# Patient Record
Sex: Female | Born: 1985 | Race: Black or African American | Hispanic: No | Marital: Single | Smoking: Former smoker
Health system: Southern US, Community
[De-identification: ages and names within clinical notes are randomized; demographics above are authoritative.]

## PROBLEM LIST (undated history)

## (undated) ENCOUNTER — Inpatient Hospital Stay (HOSPITAL_COMMUNITY): Payer: Self-pay

## (undated) DIAGNOSIS — J302 Other seasonal allergic rhinitis: Secondary | ICD-10-CM

## (undated) DIAGNOSIS — I10 Essential (primary) hypertension: Secondary | ICD-10-CM

## (undated) DIAGNOSIS — O009 Unspecified ectopic pregnancy without intrauterine pregnancy: Secondary | ICD-10-CM

## (undated) HISTORY — PX: TONSILLECTOMY: SHX5618

## (undated) HISTORY — DX: Essential (primary) hypertension: I10

## (undated) HISTORY — PX: WISDOM TOOTH EXTRACTION: SHX21

## (undated) HISTORY — PX: OTHER SURGICAL HISTORY: SHX169

## (undated) HISTORY — PX: TONSILLECTOMY: SUR1361

---
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
## 2011-12-07 ENCOUNTER — Ambulatory Visit: Admit: 2011-12-07 | Payer: Self-pay | Admitting: Obstetrics & Gynecology

## 2011-12-07 ENCOUNTER — Encounter (HOSPITAL_COMMUNITY): Payer: Self-pay

## 2011-12-07 ENCOUNTER — Inpatient Hospital Stay (HOSPITAL_COMMUNITY): Payer: BC Managed Care – PPO

## 2011-12-07 ENCOUNTER — Encounter (HOSPITAL_COMMUNITY)
Admission: AD | Disposition: A | Discharge status: Home / Self Care | Payer: Self-pay | Source: Ambulatory Visit | Attending: Obstetrics & Gynecology

## 2011-12-07 ENCOUNTER — Encounter (HOSPITAL_COMMUNITY): Payer: Self-pay | Admitting: Anesthesiology

## 2011-12-07 ENCOUNTER — Inpatient Hospital Stay (HOSPITAL_COMMUNITY)
Admission: AD | Admit: 2011-12-07 | Discharge: 2011-12-08 | Disposition: A | Discharge status: Home / Self Care | Payer: BC Managed Care – PPO | Source: Ambulatory Visit | Attending: Obstetrics & Gynecology | Admitting: Obstetrics & Gynecology

## 2011-12-07 ENCOUNTER — Inpatient Hospital Stay (HOSPITAL_COMMUNITY): Payer: BC Managed Care – PPO | Admitting: Anesthesiology

## 2011-12-07 DIAGNOSIS — R109 Unspecified abdominal pain: Secondary | ICD-10-CM | POA: Insufficient documentation

## 2011-12-07 DIAGNOSIS — O00109 Unspecified tubal pregnancy without intrauterine pregnancy: Secondary | ICD-10-CM | POA: Insufficient documentation

## 2011-12-07 DIAGNOSIS — O009 Unspecified ectopic pregnancy without intrauterine pregnancy: Secondary | ICD-10-CM

## 2011-12-07 HISTORY — PX: LAPAROSCOPY: SHX197

## 2011-12-07 LAB — URINALYSIS, ROUTINE W REFLEX MICROSCOPIC
Glucose, UA: NEGATIVE mg/dL
Ketones, ur: NEGATIVE mg/dL
Leukocytes, UA: NEGATIVE
Protein, ur: NEGATIVE mg/dL
Urobilinogen, UA: 0.2 mg/dL (ref 0.0–1.0)

## 2011-12-07 LAB — CBC
HCT: 37.7 % (ref 36.0–46.0)
Hemoglobin: 12.6 g/dL (ref 12.0–15.0)
MCH: 28.8 pg (ref 26.0–34.0)
MCHC: 33.4 g/dL (ref 30.0–36.0)
RDW: 13.1 % (ref 11.5–15.5)

## 2011-12-07 LAB — URINE MICROSCOPIC-ADD ON

## 2011-12-07 LAB — POCT PREGNANCY, URINE: Preg Test, Ur: POSITIVE — AB

## 2011-12-07 LAB — ABO/RH: ABO/RH(D): A POS

## 2011-12-07 SURGERY — LAPAROSCOPY OPERATIVE
Anesthesia: General | Site: Abdomen | Laterality: Right | Wound class: Clean Contaminated

## 2011-12-07 MED ORDER — ROCURONIUM BROMIDE 100 MG/10ML IV SOLN
INTRAVENOUS | Status: DC | PRN
  Administered 2011-12-07: 30 mg via INTRAVENOUS

## 2011-12-07 MED ORDER — DOCUSATE SODIUM 100 MG PO CAPS: 100.0000 mg | ORAL_CAPSULE | Freq: Two times a day (BID) | ORAL | Status: DC | PRN

## 2011-12-07 MED ORDER — LACTATED RINGERS IV BOLUS (SEPSIS): 1000.0000 mL | Freq: Once | INTRAVENOUS | Status: DC

## 2011-12-07 MED ORDER — LACTATED RINGERS IV SOLN
INTRAVENOUS | Status: DC
  Administered 2011-12-07 (×2): via INTRAVENOUS
  Administered 2011-12-08: 1000 mL via INTRAVENOUS

## 2011-12-07 MED ORDER — BUPIVACAINE HCL (PF) 0.5 % IJ SOLN
INTRAMUSCULAR | Status: DC | PRN
  Administered 2011-12-07: 5 mL

## 2011-12-07 MED ORDER — LACTATED RINGERS IR SOLN
Status: DC | PRN
  Administered 2011-12-07: 3000 mL

## 2011-12-07 MED ORDER — KETOROLAC TROMETHAMINE 30 MG/ML IJ SOLN: 15.0000 mg | Freq: Once | INTRAMUSCULAR | Status: AC | PRN

## 2011-12-07 MED ORDER — DEXAMETHASONE SODIUM PHOSPHATE 10 MG/ML IJ SOLN
INTRAMUSCULAR | Status: DC | PRN
  Administered 2011-12-07: 10 mg via INTRAVENOUS

## 2011-12-07 MED ORDER — CEFAZOLIN SODIUM-DEXTROSE 2-3 GM-% IV SOLR
2.0000 g | Freq: Once | INTRAVENOUS | Status: AC
  Administered 2011-12-07: 2 g via INTRAVENOUS
  Filled 2011-12-07: qty 50

## 2011-12-07 MED ORDER — GLYCOPYRROLATE 0.2 MG/ML IJ SOLN
INTRAMUSCULAR | Status: DC | PRN
  Administered 2011-12-07: 0.4 mg via INTRAVENOUS

## 2011-12-07 MED ORDER — FAMOTIDINE IN NACL 20-0.9 MG/50ML-% IV SOLN
20.0000 mg | Freq: Once | INTRAVENOUS | Status: AC
  Administered 2011-12-07: 20 mg via INTRAVENOUS
  Filled 2011-12-07: qty 50

## 2011-12-07 MED ORDER — CITRIC ACID-SODIUM CITRATE 334-500 MG/5ML PO SOLN
30.0000 mL | Freq: Once | ORAL | Status: AC
  Administered 2011-12-07: 30 mL via ORAL
  Filled 2011-12-07: qty 15

## 2011-12-07 MED ORDER — NEOSTIGMINE METHYLSULFATE 1 MG/ML IJ SOLN
INTRAMUSCULAR | Status: DC | PRN
  Administered 2011-12-07: 3 mg via INTRAVENOUS

## 2011-12-07 MED ORDER — SUCCINYLCHOLINE CHLORIDE 20 MG/ML IJ SOLN
INTRAMUSCULAR | Status: DC | PRN
  Administered 2011-12-07: 120 mg via INTRAVENOUS

## 2011-12-07 MED ORDER — LIDOCAINE HCL (CARDIAC) 20 MG/ML IV SOLN
INTRAVENOUS | Status: DC | PRN
  Administered 2011-12-07: 40 mg via INTRAVENOUS

## 2011-12-07 MED ORDER — FENTANYL CITRATE 0.05 MG/ML IJ SOLN
25.0000 ug | INTRAMUSCULAR | Status: DC | PRN
  Administered 2011-12-08 (×2): 50 ug via INTRAVENOUS

## 2011-12-07 MED ORDER — OXYCODONE-ACETAMINOPHEN 5-325 MG PO TABS: 1.0000 | ORAL_TABLET | Freq: Four times a day (QID) | ORAL | Status: DC | PRN

## 2011-12-07 MED ORDER — IBUPROFEN 600 MG PO TABS: 600.0000 mg | ORAL_TABLET | Freq: Four times a day (QID) | ORAL | Status: DC | PRN

## 2011-12-07 MED ORDER — MIDAZOLAM HCL 5 MG/5ML IJ SOLN
INTRAMUSCULAR | Status: DC | PRN
  Administered 2011-12-07: 2 mg via INTRAVENOUS

## 2011-12-07 MED ORDER — KETOROLAC TROMETHAMINE 60 MG/2ML IM SOLN
INTRAMUSCULAR | Status: DC | PRN
  Administered 2011-12-07: 30 mg via INTRAMUSCULAR

## 2011-12-07 MED ORDER — FENTANYL CITRATE 0.05 MG/ML IJ SOLN
INTRAMUSCULAR | Status: DC | PRN
  Administered 2011-12-07: 100 ug via INTRAVENOUS
  Administered 2011-12-07: 50 ug via INTRAVENOUS
  Administered 2011-12-07: 100 ug via INTRAVENOUS
  Administered 2011-12-07: 150 ug via INTRAVENOUS
  Administered 2011-12-07: 100 ug via INTRAVENOUS

## 2011-12-07 MED ORDER — ONDANSETRON HCL 4 MG/2ML IJ SOLN
INTRAMUSCULAR | Status: DC | PRN
  Administered 2011-12-07: 4 mg via INTRAVENOUS

## 2011-12-07 MED ORDER — PROPOFOL 10 MG/ML IV EMUL
INTRAVENOUS | Status: DC | PRN
  Administered 2011-12-07: 200 mg via INTRAVENOUS

## 2011-12-07 SURGICAL SUPPLY — 29 items
CABLE HIGH FREQUENCY MONO STRZ (ELECTRODE) IMPLANT
CHLORAPREP W/TINT 26ML (MISCELLANEOUS) ×2 IMPLANT
CLOTH BEACON ORANGE TIMEOUT ST (SAFETY) ×2 IMPLANT
DRSG COVADERM PLUS 2X2 (GAUZE/BANDAGES/DRESSINGS) ×2 IMPLANT
FORCEPS CUTTING 33CM 5MM (CUTTING FORCEPS) ×2 IMPLANT
GLOVE BIO SURGEON STRL SZ7 (GLOVE) ×2 IMPLANT
GLOVE BIOGEL PI IND STRL 7.0 (GLOVE) ×2 IMPLANT
GLOVE BIOGEL PI INDICATOR 7.0 (GLOVE) ×2
GOWN PREVENTION PLUS LG XLONG (DISPOSABLE) ×2 IMPLANT
GOWN STRL REIN XL XLG (GOWN DISPOSABLE) ×2 IMPLANT
NEEDLE GYRUS 33CM (NEEDLE) IMPLANT
NEEDLE INSUFFLATION 14GA 120MM (NEEDLE) ×2 IMPLANT
NS IRRIG 1000ML POUR BTL (IV SOLUTION) ×2 IMPLANT
PACK LAPAROSCOPY BASIN (CUSTOM PROCEDURE TRAY) ×2 IMPLANT
POUCH SPECIMEN RETRIEVAL 10MM (ENDOMECHANICALS) ×2 IMPLANT
PROTECTOR NERVE ULNAR (MISCELLANEOUS) ×4 IMPLANT
SEALER TISSUE G2 CVD JAW 35 (ENDOMECHANICALS) IMPLANT
SEALER TISSUE G2 CVD JAW 45CM (ENDOMECHANICALS) IMPLANT
SET IRRIG TUBING LAPAROSCOPIC (IRRIGATION / IRRIGATOR) ×2 IMPLANT
SUT VIC AB 3-0 X1 27 (SUTURE) IMPLANT
SUT VICRYL 0 UR6 27IN ABS (SUTURE) ×4 IMPLANT
SUT VICRYL 4-0 PS2 18IN ABS (SUTURE) ×2 IMPLANT
TOWEL OR 17X24 6PK STRL BLUE (TOWEL DISPOSABLE) ×4 IMPLANT
TRAY FOLEY CATH 14FR (SET/KITS/TRAYS/PACK) ×2 IMPLANT
TROCAR 12M 150ML BLUNT (TROCAR) ×2 IMPLANT
TROCAR XCEL NON-BLD 11X100MML (ENDOMECHANICALS) ×2 IMPLANT
TROCAR XCEL NON-BLD 5MMX100MML (ENDOMECHANICALS) ×4 IMPLANT
WARMER LAPAROSCOPE (MISCELLANEOUS) ×2 IMPLANT
WATER STERILE IRR 1000ML POUR (IV SOLUTION) IMPLANT

## 2011-12-07 NOTE — Op Note (Signed)
Angel Thomas PROCEDURE DATE: 12/07/2011  PREOPERATIVE DIAGNOSIS: Ruptured ectopic pregnancy POSTOPERATIVE DIAGNOSIS: Ruptured right fallopian tube ectopic pregnancy PROCEDURE: Laparoscopic right salpingectomy and removal of ectopic pregnancy; lysis of adhesions SURGEON:  Dr. Jaynie Collins ANESTHESIOLOGIST: Dana Allan, MD - Anesthesiologist Renford Dills, CRNA - CRNA Orlie Pollen, CRNA - CRNA  INDICATIONS: 26 y.o. G1P0 at [redacted]w[redacted]d here for with ruptured ectopic pregnancy. On exam, she had stable vital signs, and an acute abdomen. Hgb 12.6, blood type A POS . Patient was counseled regarding need for laparoscopic salpingectomy. Risks of surgery including bleeding which may require transfusion or reoperation, infection, injury to bowel or other surrounding organs, need for additional procedures including laparotomy and other postoperative/anesthesia complications were explained to patient.  Written informed consent was obtained.  FINDINGS:  Small amount of hemoperitoneum estimated to be about 100 ml of blood and clots.  Dilated right fallopian tube containing ectopic gestation with clots at the end of the tube; noted to have some adhesions to ipsilateral ovary and bowel.  These adhesions were lysed. Small normal appearing uterus, normal left fallopian tube, right ovary and left ovary.  ANESTHESIA: General INTRAVENOUS FLUIDS: 1200 ml ESTIMATED BLOOD LOSS:100 ml URINE OUTPUT: 400 ml SPECIMENS: Right fallopian tube containing ectopic gestation COMPLICATIONS: None immediate  PROCEDURE IN DETAIL:  The patient was taken to the operating room where general anesthesia was administered and was found to be adequate.  She was placed in the dorsal lithotomy position, and was prepped and draped in a sterile manner.  A Foley catheter was inserted into her bladder and attached to constant drainage and a uterine manipulator was then advanced into the uterus .  After an adequate timeout was performed, attention  was then turned to the patient's abdomen where a 10-mm skin incision was made on the umbilical fold.  The Veress needle was carefully introduced into the peritoneal cavity at a 45-degree angle into the abdominal wall.  Intraperitoneal placement was confirmed by drop in intraabdominal pressure with insufflation of carbon dioxide gas.  Adequate pneumoperitoneum was obtained, and the 10-mm trocar and sleeve were then advanced without difficulty into the abdomen where intraabdominal placement was confirmed by the laparoscope. A survey of the patient's pelvis and abdomen revealed the findings as above.  Two 5-mm left lower quadrant ports were placed under direct visualization.  The Nezhat suction irrigator was then used to suction the hemoperitoneum and irrigate the pelvis.  Attention was then turned to the right fallopian tube which was grasped and ligated from the underlying mesosalpinx and uterine attachment using the Gyrus instrument.  There were some adhesions as mentioned above which were lysed. Good hemostasis was noted.  The specimen was placed in an EndoCatch bag and removed from the abdomen intact.  The abdomen was desufflated, and all instruments were removed.  The fascial incision of the 10-mm site was reapproximated with a 0 Vicryl figure-of-eight stitch; and all skin incisions were closed with Dermabond. The patient tolerated the procedure well.  All instruments, needles, and sponge counts were correct x 2. The patient was taken to the recovery room in stable condition.   The patient will be discharged to home as per PACU criteria.  Routine postoperative instructions given.  She was prescribed Percocet, Ibuprofen and Colace.  She will follow up in the clinic in  About 4 weeks for postoperative evaluation.

## 2011-12-07 NOTE — MAU Provider Note (Signed)
Attestation of Attending Supervision of Advanced Practitioner (CNM/NP): Evaluation and management procedures were performed by the Advanced Practitioner under my supervision and collaboration.  I have reviewed the Advanced Practitioner's note and chart, and I agree with the management and plan.  26 y.o. G1P0 with R ruptured ectopic pregnancy.   On exam, she had stable vital signs, and benign exam  . Patient was counseled regarding need for surgical intervention via operative laparoscopy. Risks of surgery including bleeding which may require transfusion or reoperation, infection, injury to bowel or other surrounding organs, need for additional procedures including (laparoscopy or) laparotomy were explained to patient and written informed consent was obtained.  Patient has been NPO since 1330 and she will remain NPO for procedure. Anesthesia and OR aware, procedure scheduled at 2130.  Preoperative prophylactic antibiotics and SCDs ordered on call to the OR.  To OR when ready.   Jaynie Collins, MD, FACOG Attending Obstetrician & Gynecologist Faculty Practice, [facility] [place]

## 2011-12-07 NOTE — MAU Provider Note (Signed)
History     CSN: [id number]  Arrival date and time: 12/07/11 1345   First Provider Initiated Contact with Patient 12/07/11 1436      Chief Complaint  Patient presents with  . Early pregnant, bleeding   . Abdominal Cramping   HPI Comments: Angel Thomas is a 26 y.o. G1P0 with a gestational age of [redacted]w[redacted]d estimated by LMP of 10/31/11 who is here today for cramping and bleeding. Patient was unaware she was pregnant, confirmed today by urine pregnancy test, but was highly suspicious due to her late period. The bleeding and cramping started today. She describes the bleeding amount as moderate, not as heavy as her heaviest flow during her period. The cramping is diffuse across her lower abdomen. She also felt a little nauseas this morning with more reflux than usual and a decreased appetitie. No other complaints at this time. Her and her partner have been together for 2 years, does not use protection. Had intercourse on 11/17.    Abdominal Cramping Associated symptoms include nausea (slight). Pertinent negatives include no dysuria, fever or frequency.    Past Medical History  Diagnosis Date  . No pertinent past medical history     Past Surgical History  Procedure Date  . Wisdom tooth extraction   . Tonsillectomy     Family History  Problem Relation Age of Onset  . Other Neg Hx     History  Substance Use Topics  . Smoking status: Never Smoker   . Smokeless tobacco: Never Used  . Alcohol Use: No    Allergies:  Allergies  Allergen Reactions  . Almond Meal     Throat itches and tongue becomes tingly    No prescriptions prior to admission    Review of Systems  Constitutional: Negative for fever and chills.  Gastrointestinal: Positive for heartburn (has acid reflux), nausea (slight) and abdominal pain (cramping).  Genitourinary: Negative for dysuria, urgency and frequency.   Physical Exam   Blood pressure 135/92, pulse 90, temperature 97.9 F (36.6 C), temperature  source Oral, resp. rate 16, height 5\' 8"  (1.727 m), weight 103.42 kg (228 lb), last menstrual period 10/31/2011.  Physical Exam  Nursing note and vitals reviewed. Constitutional: She is oriented to person, place, and time. She appears well-developed and well-nourished. No distress.  GI: Soft. There is tenderness (slight diffuse suprapubic).  Genitourinary: There is no rash, tenderness or lesion on the right labia. There is no rash, tenderness or lesion on the left labia. Uterus is not enlarged and not tender. Cervix exhibits no motion tenderness. Right adnexum displays no mass and no tenderness. Left adnexum displays no mass and no tenderness.       Moderate amount of bleeding in the vaginal canal. Cervix appeared healthy looking without erythema. Cervical os appears closed. Uterus non tender.   Neurological: She is alert and oriented to person, place, and time.  Skin: Skin is warm and dry.  Psychiatric: She has a normal mood and affect.   Results for orders placed during the hospital encounter of 12/07/11 (from the past 24 hour(s))  URINALYSIS, ROUTINE W REFLEX MICROSCOPIC     Status: Abnormal   Collection Time   12/07/11  1:58 PM      Component Value Range   Color, Urine YELLOW  YELLOW   APPearance CLEAR  CLEAR   Specific Gravity, Urine <1.005 (*) 1.005 - 1.030   pH 6.0  5.0 - 8.0   Glucose, UA NEGATIVE  NEGATIVE mg/dL   Hgb urine  dipstick MODERATE (*) NEGATIVE   Bilirubin Urine NEGATIVE  NEGATIVE   Ketones, ur NEGATIVE  NEGATIVE mg/dL   Protein, ur NEGATIVE  NEGATIVE mg/dL   Urobilinogen, UA 0.2  0.0 - 1.0 mg/dL   Nitrite NEGATIVE  NEGATIVE   Leukocytes, UA NEGATIVE  NEGATIVE  URINE MICROSCOPIC-ADD ON     Status: Normal   Collection Time   12/07/11  1:58 PM      Component Value Range   Squamous Epithelial / LPF RARE  RARE   WBC, UA    <3 WBC/hpf   Value: NO FORMED ELEMENTS SEEN ON URINE MICROSCOPIC EXAMINATION   RBC / HPF 0-2  <3 RBC/hpf   Bacteria, UA RARE  RARE  POCT  PREGNANCY, URINE     Status: Abnormal   Collection Time   12/07/11  2:03 PM      Component Value Range   Preg Test, Ur POSITIVE (*) NEGATIVE  CBC     Status: Normal   Collection Time   12/07/11  2:45 PM      Component Value Range   WBC 6.7  4.0 - 10.5 K/uL   RBC 4.37  3.87 - 5.11 MIL/uL   Hemoglobin 12.6  12.0 - 15.0 g/dL   HCT 16.1  09.6 - 04.5 %   MCV 86.3  78.0 - 100.0 fL   MCH 28.8  26.0 - 34.0 pg   MCHC 33.4  30.0 - 36.0 g/dL   RDW 40.9  81.1 - 91.4 %   Platelets 204  150 - 400 K/uL  ABO/RH     Status: Normal (Preliminary result)   Collection Time   12/07/11  2:45 PM      Component Value Range   ABO/RH(D) A POS    HCG, QUANTITATIVE, PREGNANCY     Status: Abnormal   Collection Time   12/07/11  2:45 PM      Component Value Range   hCG, Beta Chain, Quant, S 1260 (*) <5 mIU/mL   OB Ultrasound      MAU Course  Procedures 1. Speculum exam 2. Bimanual exam 3. OB ultrasound   Assessment and Plan    Marnee Spring, PA-S2 12/07/2011, 3:55 PM   I have seen this patient and agree with the above PA student's note with the following additions.  A: 1. Ruptured ectopic pregnancy    P: Called Dr Macon Large to report assessment and findings LR 1000 ml bolus Dr Lovett Sox to see pt Admit to OR  LEFTWICH-KIRBY, Ellowyn Rieves Certified Nurse-Midwife

## 2011-12-07 NOTE — MAU Note (Signed)
Pt states went to UC this am, had +upt there, came here for further evaluation. Bleeding has picked up since visit this am. Scant smear on pad at present, however was just applied. Denies abnormal vaginal discharge. No clots noted.Marland Kitchen

## 2011-12-07 NOTE — Anesthesia Preprocedure Evaluation (Signed)
Anesthesia Evaluation  Patient identified by MRN, date of birth, ID band Patient awake    Reviewed: Allergy & Precautions, H&P , NPO status , Patient's Chart, lab work & pertinent test results, reviewed documented beta blocker date and time   History of Anesthesia Complications Negative for: history of anesthetic complications  Airway Mallampati: I TM Distance: >3 FB Neck ROM: full    Dental  (+) Teeth Intact   Pulmonary neg pulmonary ROS,  breath sounds clear to auscultation  Pulmonary exam normal       Cardiovascular negative cardio ROS  Rhythm:regular Rate:Normal     Neuro/Psych negative neurological ROS  negative psych ROS   GI/Hepatic negative GI ROS, Neg liver ROS,   Endo/Other  negative endocrine ROS  Renal/GU negative Renal ROS  negative genitourinary   Musculoskeletal   Abdominal   Peds  Hematology negative hematology ROS (+)   Anesthesia Other Findings   Reproductive/Obstetrics (+) Pregnancy (5 weeks, ectopic)                           Anesthesia Physical Anesthesia Plan  ASA: II and emergent  Anesthesia Plan: General ETT, Rapid Sequence and Cricoid Pressure   Post-op Pain Management:    Induction:   Airway Management Planned:   Additional Equipment:   Intra-op Plan:   Post-operative Plan:   Informed Consent: I have reviewed the patients History and Physical, chart, labs and discussed the procedure including the risks, benefits and alternatives for the proposed anesthesia with the patient or authorized representative who has indicated his/her understanding and acceptance.   Dental Advisory Given  Plan Discussed with: CRNA and Surgeon  Anesthesia Plan Comments:         Anesthesia Quick Evaluation

## 2011-12-07 NOTE — Transfer of Care (Signed)
Immediate Anesthesia Transfer of Care Note  Patient: Angel Thomas  Procedure(s) Performed: Procedure(s) (LRB) with comments: LAPAROSCOPY OPERATIVE (Right) - Right Salpingectomy, Lysis of Adhesions  Patient Location: PACU  Anesthesia Type:General  Level of Consciousness: awake, alert , oriented and patient cooperative  Airway & Oxygen Therapy: Patient Spontanous Breathing and Patient connected to nasal cannula oxygen  Post-op Assessment: Report given to PACU RN and Post -op Vital signs reviewed and stable  Post vital signs: Reviewed and stable  Complications: No apparent anesthesia complications

## 2011-12-07 NOTE — Preoperative (Signed)
Beta Blockers   Reason not to administer Beta Blockers:Not Applicable 

## 2011-12-08 MED ORDER — FENTANYL CITRATE 0.05 MG/ML IJ SOLN
INTRAMUSCULAR | Status: AC
  Filled 2011-12-08: qty 2

## 2011-12-08 NOTE — Anesthesia Postprocedure Evaluation (Signed)
Anesthesia Post Note  Patient: Angel Thomas  Procedure(s) Performed: Procedure(s) (LRB): LAPAROSCOPY OPERATIVE (Right)  Anesthesia type: General  Patient location: PACU  Post pain: Pain level controlled  Post assessment: Post-op Vital signs reviewed  Last Vitals:  Filed Vitals:   12/08/11 0045  BP: 125/72  Pulse: 86  Temp: 36.8 C  Resp: 18    Post vital signs: Reviewed  Level of consciousness: sedated  Complications: No apparent anesthesia complications

## 2011-12-10 ENCOUNTER — Encounter (HOSPITAL_COMMUNITY): Payer: Self-pay | Admitting: Obstetrics & Gynecology

## 2011-12-10 ENCOUNTER — Encounter: Payer: Self-pay | Admitting: *Deleted

## 2011-12-11 LAB — GC/CHLAMYDIA PROBE AMP, GENITAL: GC Probe Amp, Genital: NEGATIVE

## 2011-12-17 ENCOUNTER — Telehealth: Payer: Self-pay | Admitting: *Deleted

## 2011-12-17 NOTE — Telephone Encounter (Addendum)
Pt left message requesting results from 1 week ago.  I returned the call and informed her of the presurgical test results; GC/CT, CBC and blood type.  Pt voiced understanding and had no further questions.

## 2011-12-28 ENCOUNTER — Ambulatory Visit (INDEPENDENT_AMBULATORY_CARE_PROVIDER_SITE_OTHER): Payer: BC Managed Care – PPO | Admitting: Obstetrics & Gynecology

## 2011-12-28 ENCOUNTER — Encounter: Payer: Self-pay | Admitting: Obstetrics & Gynecology

## 2011-12-28 VITALS — BP 141/96 | HR 92 | Temp 99.6°F | Ht 68.0 in | Wt 234.1 lb

## 2011-12-28 DIAGNOSIS — Z3042 Encounter for surveillance of injectable contraceptive: Secondary | ICD-10-CM

## 2011-12-28 DIAGNOSIS — Z09 Encounter for follow-up examination after completed treatment for conditions other than malignant neoplasm: Secondary | ICD-10-CM

## 2011-12-28 DIAGNOSIS — IMO0001 Reserved for inherently not codable concepts without codable children: Secondary | ICD-10-CM

## 2011-12-28 DIAGNOSIS — Z3049 Encounter for surveillance of other contraceptives: Secondary | ICD-10-CM

## 2011-12-28 DIAGNOSIS — Z309 Encounter for contraceptive management, unspecified: Secondary | ICD-10-CM

## 2011-12-28 MED ORDER — NORGESTIMATE-ETH ESTRADIOL 0.25-35 MG-MCG PO TABS: 1.0000 | ORAL_TABLET | Freq: Every day | ORAL | Status: DC

## 2011-12-28 NOTE — Progress Notes (Signed)
  Subjective:     Angel Thomas is a 26 y.o. female who presents to the clinic s/p laparoscopic right salpingectomy on 12/08/11 for ruptured ectopic pregnancy.  Eating a regular diet without difficulty. Bowel movements are normal. The patient is not having any pain.  The following portions of the patient's history were reviewed and updated as appropriate: allergies, current medications, past family history, past medical history, past social history, past surgical history and problem list. Last pap was 10/2010 and was normal, gets pap smears at her GYN's office in [place].  Review of Systems Pertinent items are noted in HPI.    Objective:    BP 141/96  Pulse 92  Temp 99.6 F (37.6 C) (Oral)  Ht 5\' 8"  (1.727 m)  Wt 234 lb 1.6 oz (106.187 kg)  BMI 35.59 kg/m2  LMP 10/31/2011  Breastfeeding? Unknown General:  alert and cooperative  Abdomen: soft, bowel sounds active, non-tender  Incisions:   healing well, no drainage, no erythema, no hernia, no seroma, no swelling, well approximated, no dehiscence, incision well approximated     12/08/11 Pathology Fallopian tube, ectopic pregnancy, Right: BENIGN FALLOPIAN TUBE WITH FINDINGS CONSISTENT WITH ECTOPIC PREGNANCY (FETAL CHORIONIC VILLI IDENTIFIED).  Assessment:    Doing well postoperatively. Operative findings again reviewed. Pathology report discussed.    Plan:   1. Continue any current medications. 2. Patient desires OCPs for contraception, has used them in the past.  OCPs prescribed.  Recommended condoms for STI prevention. Also recommended HPV vaccine. 3. Activity restrictions: none 4. Anticipated return to work: now.  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 5. Follow up: as needed.

## 2011-12-28 NOTE — Patient Instructions (Signed)
Return to clinic for any obstetric concerns or go to MAU for evaluation  

## 2012-01-30 ENCOUNTER — Encounter: Payer: Self-pay | Admitting: *Deleted

## 2012-03-01 ENCOUNTER — Other Ambulatory Visit: Payer: Self-pay

## 2012-11-20 ENCOUNTER — Other Ambulatory Visit: Payer: Self-pay

## 2013-02-05 ENCOUNTER — Other Ambulatory Visit: Payer: Self-pay | Admitting: Obstetrics & Gynecology

## 2013-03-02 ENCOUNTER — Ambulatory Visit: Payer: BC Managed Care – PPO | Admitting: Obstetrics & Gynecology

## 2013-04-10 ENCOUNTER — Other Ambulatory Visit: Payer: Self-pay | Admitting: Obstetrics & Gynecology

## 2013-04-14 ENCOUNTER — Telehealth: Payer: Self-pay | Admitting: General Practice

## 2013-04-14 NOTE — Telephone Encounter (Signed)
Message copied by Kathee DeltonHILLMAN, Kieara Schwark L on Tue Apr 14, 2013  4:22 PM ------      Message from: Jaynie CollinsANYANWU, UGONNA A      Created: Tue Apr 14, 2013  3:28 PM      Regarding: OCP request       One month supply only.  [facility] Also needs pap smear, routine gyn care.            UAA      ----- Message -----         From: Kathee Deltonarrie L Danel Requena, RN         Sent: 04/14/2013   3:24 PM           To: Tereso NewcomerUgonna A Anyanwu, MD            We received a request from this patient's pharmacy for a refill on her sprintec. You last saw her December 2013. She had an appt last month but no showed. Please advise            Thanks,      Lyla Sonarrie, RN        ------

## 2013-04-14 NOTE — Telephone Encounter (Signed)
Called patient, no answer- left message that we are trying to reach you about a medication you recently requested a refill on, please call us back at the clinics. Medication has already been ordered.

## 2013-04-15 MED ORDER — NORGESTIMATE-ETH ESTRADIOL 0.25-35 MG-MCG PO TABS: 1.0000 | ORAL_TABLET | Freq: Every day | ORAL | Status: DC

## 2013-04-15 NOTE — Telephone Encounter (Signed)
Called pt and informed pt that she her BC is at her pharmacy.  Pt stated that she has already picked up.  Pt also informed me that when she called to make an appt she was told that there are no appts in April and to call back in a couple weeks to schedule an appt; pt stated that she would be needing another refill on the medication.  I advised pt that I would send another refill on her medication so that way she will be covered until she is able to make an appt when the schedule opens up.  Sent inbasket to Admin pool to call patient when schedule opens.

## 2013-05-20 ENCOUNTER — Ambulatory Visit: Payer: BC Managed Care – PPO | Admitting: Obstetrics & Gynecology

## 2013-05-25 ENCOUNTER — Ambulatory Visit: Payer: BC Managed Care – PPO | Admitting: Obstetrics & Gynecology

## 2013-07-13 ENCOUNTER — Encounter: Payer: Self-pay | Admitting: Obstetrics & Gynecology

## 2013-07-13 ENCOUNTER — Ambulatory Visit (INDEPENDENT_AMBULATORY_CARE_PROVIDER_SITE_OTHER): Payer: 59 | Admitting: Obstetrics & Gynecology

## 2013-07-13 VITALS — BP 143/92 | HR 91 | Temp 98.0°F | Ht 68.0 in | Wt 263.5 lb

## 2013-07-13 DIAGNOSIS — Z3041 Encounter for surveillance of contraceptive pills: Secondary | ICD-10-CM

## 2013-07-13 DIAGNOSIS — Z113 Encounter for screening for infections with a predominantly sexual mode of transmission: Secondary | ICD-10-CM

## 2013-07-13 DIAGNOSIS — Z01419 Encounter for gynecological examination (general) (routine) without abnormal findings: Secondary | ICD-10-CM

## 2013-07-13 MED ORDER — NORGESTIMATE-ETH ESTRADIOL 0.25-35 MG-MCG PO TABS: 1.0000 | ORAL_TABLET | Freq: Every day | ORAL | Status: DC

## 2013-07-13 NOTE — Patient Instructions (Signed)
Preventive Care for Adults A healthy lifestyle and preventive care can promote health and wellness. Preventive health guidelines for women include the following key practices.  A routine yearly physical is a good way to check with your health care Angel Thomas about your health and preventive screening. It is a chance to share any concerns and updates on your health and to receive a thorough exam.  Visit your dentist for a routine exam and preventive care every 6 months. Brush your teeth twice a day and floss once a day. Good oral hygiene prevents tooth decay and gum disease.  The frequency of eye exams is based on your age, health, family medical history, use of contact lenses, and other factors. Follow your health care Angel Thomas's recommendations for frequency of eye exams.  Eat a healthy diet. Foods like vegetables, fruits, whole grains, low-fat dairy products, and lean protein foods contain the nutrients you need without too many calories. Decrease your intake of foods high in solid fats, added sugars, and salt. Eat the right amount of calories for you.Get information about a proper diet from your health care Angel Thomas, if necessary.  Regular physical exercise is one of the most important things you can do for your health. Most adults should get at least 150 minutes of moderate-intensity exercise (any activity that increases your heart rate and causes you to sweat) each week. In addition, most adults need muscle-strengthening exercises on 2 or more days a week.  Maintain a healthy weight. The body mass index (BMI) is a screening tool to identify possible weight problems. It provides an estimate of body fat based on height and weight. Your health care Angel Thomas can find your BMI, and can help you achieve or maintain a healthy weight.For adults 20 years and older:  A BMI below 18.5 is considered underweight.  A BMI of 18.5 to 24.9 is normal.  A BMI of 25 to 29.9 is considered overweight.  A BMI  of 30 and above is considered obese.  Maintain normal blood lipids and cholesterol levels by exercising and minimizing your intake of saturated fat. Eat a balanced diet with plenty of fruit and vegetables. Blood tests for lipids and cholesterol should begin at age 47 and be repeated every 5 years. If your lipid or cholesterol levels are high, you are over 50, or you are at high risk for heart disease, you may need your cholesterol levels checked more frequently.Ongoing high lipid and cholesterol levels should be treated with medicines if diet and exercise are not working.  If you smoke, find out from your health care Angel Thomas how to quit. If you do not use tobacco, do not start.  Lung cancer screening is recommended for adults aged 40-80 years who are at high risk for developing lung cancer because of a history of smoking. A yearly low-dose CT scan of the lungs is recommended for people who have at least a 30-pack-year history of smoking and are a current smoker or have quit within the past 15 years. A pack year of smoking is smoking an average of 1 pack of cigarettes a day for 1 year (for example: 1 pack a day for 30 years or 2 packs a day for 15 years). Yearly screening should continue until the smoker has stopped smoking for at least 15 years. Yearly screening should be stopped for people who develop a health problem that would prevent them from having lung cancer treatment.  If you are pregnant, do not drink alcohol. If you are breastfeeding,  be very cautious about drinking alcohol. If you are not pregnant and choose to drink alcohol, do not have more than 1 drink per day. One drink is considered to be 12 ounces (355 mL) of beer, 5 ounces (148 mL) of wine, or 1.5 ounces (44 mL) of liquor.  Avoid use of street drugs. Do not share needles with anyone. Ask for help if you need support or instructions about stopping the use of drugs.  High blood pressure causes heart disease and increases the risk of  stroke. Your blood pressure should be checked at least every 1 to 2 years. Ongoing high blood pressure should be treated with medicines if weight loss and exercise do not work.  If you are 75-52 years old, ask your health care Angel Thomas if you should take aspirin to prevent strokes.  Diabetes screening involves taking a blood sample to check your fasting blood sugar level. This should be done once every 3 years, after age 15, if you are within normal weight and without risk factors for diabetes. Testing should be considered at a younger age or be carried out more frequently if you are overweight and have at least 1 risk factor for diabetes.  Breast cancer screening is essential preventive care for women. You should practice "breast self-awareness." This means understanding the normal appearance and feel of your breasts and may include breast self-examination. Any changes detected, no matter how small, should be reported to a health care Angel Thomas. Women in their 58s and 30s should have a clinical breast exam (CBE) by a health care Angel Thomas as part of a regular health exam every 1 to 3 years. After age 16, women should have a CBE every year. Starting at age 53, women should consider having a mammogram (breast X-ray test) every year. Women who have a family history of breast cancer should talk to their health care Angel Thomas about genetic screening. Women at a high risk of breast cancer should talk to their health care providers about having an MRI and a mammogram every year.  Breast cancer gene (BRCA)-related cancer risk assessment is recommended for women who have family members with BRCA-related cancers. BRCA-related cancers include breast, ovarian, tubal, and peritoneal cancers. Having family members with these cancers may be associated with an increased risk for harmful changes (mutations) in the breast cancer genes BRCA1 and BRCA2. Results of the assessment will determine the need for genetic counseling and  BRCA1 and BRCA2 testing.  Routine pelvic exams to screen for cancer are no longer recommended for nonpregnant women who are considered low risk for cancer of the pelvic organs (ovaries, uterus, and vagina) and who do not have symptoms. Ask your health care Angel Thomas if a screening pelvic exam is right for you.  If you have had past treatment for cervical cancer or a condition that could lead to cancer, you need Pap tests and screening for cancer for at least 20 years after your treatment. If Pap tests have been discontinued, your risk factors (such as having a new sexual partner) need to be reassessed to determine if screening should be resumed. Some women have medical problems that increase the chance of getting cervical cancer. In these cases, your health care Angel Thomas may recommend more frequent screening and Pap tests.  The HPV test is an additional test that may be used for cervical cancer screening. The HPV test looks for the virus that can cause the cell changes on the cervix. The cells collected during the Pap test can be  tested for HPV. The HPV test could be used to screen women aged 47 years and older, and should be used in women of any age who have unclear Pap test results. After the age of 36, women should have HPV testing at the same frequency as a Pap test.  Colorectal cancer can be detected and often prevented. Most routine colorectal cancer screening begins at the age of 38 years and continues through age 58 years. However, your health care Angel Thomas may recommend screening at an earlier age if you have risk factors for colon cancer. On a yearly basis, your health care Angel Thomas may provide home test kits to check for hidden blood in the stool. Use of a small camera at the end of a tube, to directly examine the colon (sigmoidoscopy or colonoscopy), can detect the earliest forms of colorectal cancer. Talk to your health care Angel Thomas about this at age 64, when routine screening begins. Direct  exam of the colon should be repeated every 5-10 years through age 21 years, unless early forms of pre-cancerous polyps or small growths are found.  People who are at an increased risk for hepatitis B should be screened for this virus. You are considered at high risk for hepatitis B if:  You were born in a country where hepatitis B occurs often. Talk with your health care Angel Thomas about which countries are considered high risk.  Your parents were born in a high-risk country and you have not received a shot to protect against hepatitis B (hepatitis B vaccine).  You have HIV or AIDS.  You use needles to inject street drugs.  You live with, or have sex with, someone who has Hepatitis B.  You get hemodialysis treatment.  You take certain medicines for conditions like cancer, organ transplantation, and autoimmune conditions.  Hepatitis C blood testing is recommended for all people born from [birth year] through 1965 and any individual with known risks for hepatitis C.  Practice safe sex. Use condoms and avoid high-risk sexual practices to reduce the spread of sexually transmitted infections (STIs). STIs include gonorrhea, chlamydia, syphilis, trichomonas, herpes, HPV, and human immunodeficiency virus (HIV). Herpes, HIV, and HPV are viral illnesses that have no cure. They can result in disability, cancer, and death.  You should be screened for sexually transmitted illnesses (STIs) including gonorrhea and chlamydia if:  You are sexually active and are younger than 24 years.  You are older than 24 years and your health care Angel Thomas tells you that you are at risk for this type of infection.  Your sexual activity has changed since you were last screened and you are at an increased risk for chlamydia or gonorrhea. Ask your health care Angel Thomas if you are at risk.  If you are at risk of being infected with HIV, it is recommended that you take a prescription medicine daily to prevent HIV infection. This is  called preexposure prophylaxis (PrEP). You are considered at risk if:  You are a heterosexual woman, are sexually active, and are at increased risk for HIV infection.  You take drugs by injection.  You are sexually active with a partner who has HIV.  Talk with your health care Angel Thomas about whether you are at high risk of being infected with HIV. If you choose to begin PrEP, you should first be tested for HIV. You should then be tested every 3 months for as long as you are taking PrEP.  Osteoporosis is a disease in which the bones lose minerals and strength  with aging. This can result in serious bone fractures or breaks. The risk of osteoporosis can be identified using a bone density scan. Women ages 65 years and over and women at risk for fractures or osteoporosis should discuss screening with their health care providers. Ask your health care Ustin Cruickshank whether you should take a calcium supplement or vitamin D to reduce the rate of osteoporosis.  Menopause can be associated with physical symptoms and risks. Hormone replacement therapy is available to decrease symptoms and risks. You should talk to your health care Blakelee Allington about whether hormone replacement therapy is right for you.  Use sunscreen. Apply sunscreen liberally and repeatedly throughout the day. You should seek shade when your shadow is shorter than you. Protect yourself by wearing long sleeves, pants, a wide-brimmed hat, and sunglasses year round, whenever you are outdoors.  Once a month, do a whole body skin exam, using a mirror to look at the skin on your back. Tell your health care Frazer [place] of new moles, moles that have irregular borders, moles that are larger than a pencil eraser, or moles that have changed in shape or color.  Stay current with required vaccines (immunizations).  Influenza vaccine. All adults should be immunized every year.  Tetanus, diphtheria, and acellular pertussis (Td, Tdap) vaccine. Pregnant women should  receive 1 dose of Tdap vaccine during each pregnancy. The dose should be obtained regardless of the length of time since the last dose. Immunization is preferred during the 27th-36th week of gestation. An adult who has not previously received Tdap or who does not know her vaccine status should receive 1 dose of Tdap. This initial dose should be followed by tetanus and diphtheria toxoids (Td) booster doses every 10 years. Adults with an unknown or incomplete history of completing a 3-dose immunization series with Td-containing vaccines should begin or complete a primary immunization series including a Tdap dose. Adults should receive a Td booster every 10 years.  Varicella vaccine. An adult without evidence of immunity to varicella should receive 2 doses or a second dose if she has previously received 1 dose. Pregnant females who do not have evidence of immunity should receive the first dose after pregnancy. This first dose should be obtained before leaving the health care facility. The second dose should be obtained 4-8 weeks after the first dose.  Human papillomavirus (HPV) vaccine. Females aged 13-26 years who have not received the vaccine previously should obtain the 3-dose series. The vaccine is not recommended for use in pregnant females. However, pregnancy testing is not needed before receiving a dose. If a female is found to be pregnant after receiving a dose, no treatment is needed. In that case, the remaining doses should be delayed until after the pregnancy. Immunization is recommended for any person with an immunocompromised condition through the age of 26 years if she did not get any or all doses earlier. During the 3-dose series, the second dose should be obtained 4-8 weeks after the first dose. The third dose should be obtained 24 weeks after the first dose and 16 weeks after the second dose.  Zoster vaccine. One dose is recommended for adults aged 60 years or older unless certain conditions are  present.  Measles, mumps, and rubella (MMR) vaccine. Adults born before 1957 generally are considered immune to measles and mumps. Adults born in 1957 or later should have 1 or more doses of MMR vaccine unless there is a contraindication to the vaccine or there is laboratory evidence of immunity to   each of the three diseases. A routine second dose of MMR vaccine should be obtained at least 28 days after the first dose for students attending postsecondary schools, health care workers, or international travelers. People who received inactivated measles vaccine or an unknown type of measles vaccine during 1963-1967 should receive 2 doses of MMR vaccine. People who received inactivated mumps vaccine or an unknown type of mumps vaccine before 1979 and are at high risk for mumps infection should consider immunization with 2 doses of MMR vaccine. For females of childbearing age, rubella immunity should be determined. If there is no evidence of immunity, females who are not pregnant should be vaccinated. If there is no evidence of immunity, females who are pregnant should delay immunization until after pregnancy. Unvaccinated health care workers born before 1957 who lack laboratory evidence of measles, mumps, or rubella immunity or laboratory confirmation of disease should consider measles and mumps immunization with 2 doses of MMR vaccine or rubella immunization with 1 dose of MMR vaccine.  Pneumococcal 13-valent conjugate (PCV13) vaccine. When indicated, a person who is uncertain of her immunization history and has no record of immunization should receive the PCV13 vaccine. An adult aged 19 years or older who has certain medical conditions and has not been previously immunized should receive 1 dose of PCV13 vaccine. This PCV13 should be followed with a dose of pneumococcal polysaccharide (PPSV23) vaccine. The PPSV23 vaccine dose should be obtained at least 8 weeks after the dose of PCV13 vaccine. An adult aged 19  years or older who has certain medical conditions and previously received 1 or more doses of PPSV23 vaccine should receive 1 dose of PCV13. The PCV13 vaccine dose should be obtained 1 or more years after the last PPSV23 vaccine dose.  Pneumococcal polysaccharide (PPSV23) vaccine. When PCV13 is also indicated, PCV13 should be obtained first. All adults aged 65 years and older should be immunized. An adult younger than age 65 years who has certain medical conditions should be immunized. Any person who resides in a nursing home or long-term care facility should be immunized. An adult smoker should be immunized. People with an immunocompromised condition and certain other conditions should receive both PCV13 and PPSV23 vaccines. People with human immunodeficiency virus (HIV) infection should be immunized as soon as possible after diagnosis. Immunization during chemotherapy or radiation therapy should be avoided. Routine use of PPSV23 vaccine is not recommended for American Indians, [place] Natives, or people younger than 65 years unless there are medical conditions that require PPSV23 vaccine. When indicated, people who have unknown immunization and have no record of immunization should receive PPSV23 vaccine. One-time revaccination 5 years after the first dose of PPSV23 is recommended for people aged 19-64 years who have chronic kidney failure, nephrotic syndrome, asplenia, or immunocompromised conditions. People who received 1-2 doses of PPSV23 before age 65 years should receive another dose of PPSV23 vaccine at age 65 years or later if at least 5 years have passed since the previous dose. Doses of PPSV23 are not needed for people immunized with PPSV23 at or after age 65 years.  Meningococcal vaccine. Adults with asplenia or persistent complement component deficiencies should receive 2 doses of quadrivalent meningococcal conjugate (MenACWY-D) vaccine. The doses should be obtained at least 2 months apart.  Microbiologists working with certain meningococcal bacteria, military recruits, people at risk during an outbreak, and people who travel to or live in countries with a high rate of meningitis should be immunized. A first-year college student up through age   21 years who is living in a residence hall should receive a dose if she did not receive a dose on or after her 16th birthday. Adults who have certain high-risk conditions should receive one or more doses of vaccine.  Hepatitis A vaccine. Adults who wish to be protected from this disease, have certain high-risk conditions, work with hepatitis A-infected animals, work in hepatitis A research labs, or travel to or work in countries with a high rate of hepatitis A should be immunized. Adults who were previously unvaccinated and who anticipate close contact with an international adoptee during the first 60 days after arrival in the [place] from a country with a high rate of hepatitis A should be immunized.  Hepatitis B vaccine. Adults who wish to be protected from this disease, have certain high-risk conditions, may be exposed to blood or other infectious body fluids, are household contacts or sex partners of hepatitis B positive people, are clients or workers in certain care facilities, or travel to or work in countries with a high rate of hepatitis B should be immunized.  Haemophilus influenzae type b (Hib) vaccine. A previously unvaccinated person with asplenia or sickle cell disease or having a scheduled splenectomy should receive 1 dose of Hib vaccine. Regardless of previous immunization, a recipient of a hematopoietic stem cell transplant should receive a 3-dose series 6-12 months after her successful transplant. Hib vaccine is not recommended for adults with HIV infection. Preventive Services / Frequency Ages 43 to 39years  Blood pressure check.** / Every 1 to 2 years.  Lipid and cholesterol check.** / Every 5 years beginning at age  75.  Clinical breast exam.** / Every 3 years for women in their 32s and 74s.  BRCA-related cancer risk assessment.** / For women who have family members with a BRCA-related cancer (breast, ovarian, tubal, or peritoneal cancers).  Pap test.** / Every 2 years from ages 65 through 91. Every 3 years starting at age 34 through age 93 or 72 with a history of 3 consecutive normal Pap tests.  HPV screening.** / Every 3 years from ages 46 through ages 53 to 26 with a history of 3 consecutive normal Pap tests.  Hepatitis C blood test.** / For any individual with known risks for hepatitis C.  Skin self-exam. / Monthly.  Influenza vaccine. / Every year.  Tetanus, diphtheria, and acellular pertussis (Tdap, Td) vaccine.** / Consult your health care Kortney Potvin. Pregnant women should receive 1 dose of Tdap vaccine during each pregnancy. 1 dose of Td every 10 years.  Varicella vaccine.** / Consult your health care Rakeisha Nyce. Pregnant females who do not have evidence of immunity should receive the first dose after pregnancy.  HPV vaccine. / 3 doses over 6 months, if 70 and younger. The vaccine is not recommended for use in pregnant females. However, pregnancy testing is not needed before receiving a dose.  Measles, mumps, rubella (MMR) vaccine.** / You need at least 1 dose of MMR if you were born in 1957 or later. You may also need a 2nd dose. For females of childbearing age, rubella immunity should be determined. If there is no evidence of immunity, females who are not pregnant should be vaccinated. If there is no evidence of immunity, females who are pregnant should delay immunization until after pregnancy.  Pneumococcal 13-valent conjugate (PCV13) vaccine.** / Consult your health care Vadie Principato.  Pneumococcal polysaccharide (PPSV23) vaccine.** / 1 to 2 doses if you smoke cigarettes or if you have certain conditions.  Meningococcal vaccine.** /  1 dose if you are age 70 to 51 years and a [place] living in a residence hall, or have one of several medical conditions, you need to get vaccinated against meningococcal disease. You may also need additional booster doses.  Hepatitis A vaccine.** / Consult your health care Samhitha Rosen.  Hepatitis B vaccine.** / Consult your health care Chris Cripps.  Haemophilus influenzae type b (Hib) vaccine.** / Consult your health care Lucky Alverson. Ages 40 to 64years  Blood pressure check.** / Every 1 to 2 years.  Lipid and cholesterol check.** / Every 5 years beginning at age 58 years.  Lung cancer screening. / Every year if you are aged 56-80 years and have a 30-pack-year history of smoking and currently smoke or have quit within the past 15 years. Yearly screening is stopped once you have quit smoking for at least 15 years or develop a health problem that would prevent you from having lung cancer treatment.  Clinical breast exam.** / Every year after age 35 years.  BRCA-related cancer risk assessment.** / For women who have family members with a BRCA-related cancer (breast, ovarian, tubal, or peritoneal cancers).  Mammogram.** / Every year beginning at age 109 years and continuing for as long as you are in good health. Consult with your health care Kalum Minner.  Pap test.** / Every 3 years starting at age 44 years through age 94 or 70 years with a history of 3 consecutive normal Pap tests.  HPV screening.** / Every 3 years from ages 109 years through ages 50 to 30 years with a history of 3 consecutive normal Pap tests.  Fecal occult blood test (FOBT) of stool. / Every year beginning at age 73 years and continuing until age 59 years. You may not need to do this test if you get a colonoscopy every 10 years.  Flexible sigmoidoscopy or colonoscopy.** / Every 5 years for a flexible sigmoidoscopy or every 10 years for a colonoscopy beginning at age 68 years and continuing until age 12 years.  Hepatitis C blood test.** / For all people born from [birth year] through  1963 and any individual with known risks for hepatitis C.  Skin self-exam. / Monthly.  Influenza vaccine. / Every year.  Tetanus, diphtheria, and acellular pertussis (Tdap/Td) vaccine.** / Consult your health care Zakiyah Diop. Pregnant women should receive 1 dose of Tdap vaccine during each pregnancy. 1 dose of Td every 10 years.  Varicella vaccine.** / Consult your health care Kasee Hantz. Pregnant females who do not have evidence of immunity should receive the first dose after pregnancy.  Zoster vaccine.** / 1 dose for adults aged 2 years or older.  Measles, mumps, rubella (MMR) vaccine.** / You need at least 1 dose of MMR if you were born in 1955 or later. You may also need a 2nd dose. For females of childbearing age, rubella immunity should be determined. If there is no evidence of immunity, females who are not pregnant should be vaccinated. If there is no evidence of immunity, females who are pregnant should delay immunization until after pregnancy.  Pneumococcal 13-valent conjugate (PCV13) vaccine.** / Consult your health care Kaeli Nichelson.  Pneumococcal polysaccharide (PPSV23) vaccine.** / 1 to 2 doses if you smoke cigarettes or if you have certain conditions.  Meningococcal vaccine.** / Consult your health care Todd Argabright.  Hepatitis A vaccine.** / Consult your health care Kincade Granberg.  Hepatitis B vaccine.** / Consult your health care Avelino Herren.  Haemophilus influenzae type b (Hib) vaccine.** / Consult your health care Stefani Baik. Ages 48 years  and over  Blood pressure check.** / Every 1 to 2 years.  Lipid and cholesterol check.** / Every 5 years beginning at age 84 years.  Lung cancer screening. / Every year if you are aged 50-80 years and have a 30-pack-year history of smoking and currently smoke or have quit within the past 15 years. Yearly screening is stopped once you have quit smoking for at least 15 years or develop a health problem that would prevent you from having lung cancer  treatment.  Clinical breast exam.** / Every year after age 24 years.  BRCA-related cancer risk assessment.** / For women who have family members with a BRCA-related cancer (breast, ovarian, tubal, or peritoneal cancers).  Mammogram.** / Every year beginning at age 14 years and continuing for as long as you are in good health. Consult with your health care Anmol Paschen.  Pap test.** / Every 3 years starting at age 17 years through age 31 or 74 years with 3 consecutive normal Pap tests. Testing can be stopped between 65 and 70 years with 3 consecutive normal Pap tests and no abnormal Pap or HPV tests in the past 10 years.  HPV screening.** / Every 3 years from ages 30 years through ages 70 or 28 years with a history of 3 consecutive normal Pap tests. Testing can be stopped between 65 and 70 years with 3 consecutive normal Pap tests and no abnormal Pap or HPV tests in the past 10 years.  Fecal occult blood test (FOBT) of stool. / Every year beginning at age 64 years and continuing until age 92 years. You may not need to do this test if you get a colonoscopy every 10 years.  Flexible sigmoidoscopy or colonoscopy.** / Every 5 years for a flexible sigmoidoscopy or every 10 years for a colonoscopy beginning at age 73 years and continuing until age 39 years.  Hepatitis C blood test.** / For all people born from [birth year] through 1963 and any individual with known risks for hepatitis C.  Osteoporosis screening.** / A one-time screening for women ages 35 years and over and women at risk for fractures or osteoporosis.  Skin self-exam. / Monthly.  Influenza vaccine. / Every year.  Tetanus, diphtheria, and acellular pertussis (Tdap/Td) vaccine.** / 1 dose of Td every 10 years.  Varicella vaccine.** / Consult your health care Divina Neale.  Zoster vaccine.** / 1 dose for adults aged 59 years or older.  Pneumococcal 13-valent conjugate (PCV13) vaccine.** / Consult your health care Shirlena Brinegar.  Pneumococcal  polysaccharide (PPSV23) vaccine.** / 1 dose for all adults aged 8 years and older.  Meningococcal vaccine.** / Consult your health care Lamario Mani.  Hepatitis A vaccine.** / Consult your health care Seaborn Nakama.  Hepatitis B vaccine.** / Consult your health care Zamya Culhane.  Haemophilus influenzae type b (Hib) vaccine.** / Consult your health care Karlen Barbar. ** Family history and personal history of risk and conditions may change your health care Jaskirat Zertuche's recommendations. Document Released: 02/27/2001 Document Revised: 01/06/2013 Document Reviewed: 05/29/2010 [facility] Patient Information 2015 [place], [place]. This information is not intended to replace advice given to you by your health care Beatris Belen. Make sure you discuss any questions you have with your health care Prill Mabry.

## 2013-07-13 NOTE — Progress Notes (Signed)
    GYNECOLOGY CLINIC ANNUAL PREVENTATIVE CARE ENCOUNTER NOTE  Subjective:     Angel Thomas is a 28 y.o. 591P0010 female here for a routine annual gynecologic exam.  Current complaints: none.  Desires OCP refill (has not taken in one month) and STI testing.  Of note, patient is s/p laparoscopic right salpingectomy on 12/08/11 for ruptured ectopic pregnancy.   Obstetric and Gynecologic History S/p laparoscopic right salpingectomy on 12/08/11 for ruptured ectopic pregnancy. Patient's last menstrual period was 07/05/2013. Contraception: OCP (estrogen/progesterone); has not taken in one month Last Pap: 10/2010. Results were: normal  The following portions of the patient's history were reviewed and updated as appropriate: allergies, current medications, past family history, past medical history, past social history, past surgical history and problem list.  Review of Systems A comprehensive review of systems was negative.    Objective:   BP 143/92  Pulse 91  Temp(Src) 98 F (36.7 C) (Oral)  Ht 5\' 8"  (1.727 m)  Wt 263 lb 8 oz (119.523 kg)  BMI 40.07 kg/m2  LMP 07/05/2013  Breastfeeding? No GENERAL: Well-developed, well-nourished female in no acute distress.  HEENT: Normocephalic, atraumatic. Sclerae anicteric.  NECK: Supple. Normal thyroid.  LUNGS: Clear to auscultation bilaterally.  HEART: Regular rate and rhythm. BREASTS: Symmetric in size. No masses, skin changes, nipple drainage, or lymphadenopathy. ABDOMEN: Soft, nontender, nondistended. No organomegaly. PELVIC: Normal external female genitalia. Vagina is pink and rugated.  Normal discharge. Normal cervix contour. Pap smear obtained. Uterus is normal in size. No adnexal mass or tenderness.  EXTREMITIES: No cyanosis, clubbing, or edema, 2+ distal pulses.  Assessment:   Annual gynecologic examination Desires OCP refill Desires STI screen   Plan:   Pap and STI screen done, will follow up results and manage accordingly OCPs  refilled; will recheck BP in one month given that it is 143/92 Routine preventative health maintenance measures emphasized    Jaynie CollinsUGONNA  Cloee Dunwoody, MD, FACOG Attending Obstetrician & Gynecologist [facility]

## 2013-07-14 LAB — CYTOLOGY - PAP

## 2013-07-14 LAB — HEPATITIS C ANTIBODY: HCV AB: NEGATIVE

## 2013-07-14 LAB — RPR

## 2013-07-14 LAB — HIV ANTIBODY (ROUTINE TESTING W REFLEX): HIV 1&2 Ab, 4th Generation: NONREACTIVE

## 2013-08-12 ENCOUNTER — Ambulatory Visit: Payer: 59

## 2013-11-16 ENCOUNTER — Encounter: Payer: Self-pay | Admitting: Obstetrics & Gynecology

## 2014-04-21 ENCOUNTER — Encounter (HOSPITAL_COMMUNITY): Payer: Self-pay

## 2014-04-21 ENCOUNTER — Emergency Department (HOSPITAL_COMMUNITY)
Admission: EM | Admit: 2014-04-21 | Discharge: 2014-04-21 | Disposition: A | Discharge status: Home / Self Care | Payer: 59 | Source: Home / Self Care | Attending: Emergency Medicine | Admitting: Emergency Medicine

## 2014-04-21 DIAGNOSIS — J4521 Mild intermittent asthma with (acute) exacerbation: Secondary | ICD-10-CM

## 2014-04-21 DIAGNOSIS — J301 Allergic rhinitis due to pollen: Secondary | ICD-10-CM | POA: Diagnosis not present

## 2014-04-21 MED ORDER — TRIAMCINOLONE ACETONIDE 40 MG/ML IJ SUSP
INTRAMUSCULAR | Status: AC
  Filled 2014-04-21: qty 1

## 2014-04-21 MED ORDER — TRIAMCINOLONE ACETONIDE 40 MG/ML IJ SUSP
40.0000 mg | Freq: Once | INTRAMUSCULAR | Status: AC
  Administered 2014-04-21: 40 mg via INTRAMUSCULAR

## 2014-04-21 MED ORDER — IPRATROPIUM BROMIDE 0.02 % IN SOLN
RESPIRATORY_TRACT | Status: AC
  Filled 2014-04-21: qty 2.5

## 2014-04-21 MED ORDER — IPRATROPIUM-ALBUTEROL 0.5-2.5 (3) MG/3ML IN SOLN
RESPIRATORY_TRACT | Status: AC
  Filled 2014-04-21: qty 3

## 2014-04-21 MED ORDER — IPRATROPIUM-ALBUTEROL 0.5-2.5 (3) MG/3ML IN SOLN
3.0000 mL | Freq: Once | RESPIRATORY_TRACT | Status: AC
  Administered 2014-04-21: 3 mL via RESPIRATORY_TRACT

## 2014-04-21 MED ORDER — ALBUTEROL SULFATE HFA 108 (90 BASE) MCG/ACT IN AERS: 2.0000 | INHALATION_SPRAY | RESPIRATORY_TRACT | Status: AC | PRN

## 2014-04-21 MED ORDER — PREDNISONE 20 MG PO TABS: ORAL_TABLET | ORAL | Status: DC

## 2014-04-21 MED ORDER — ALBUTEROL SULFATE (2.5 MG/3ML) 0.083% IN NEBU
2.5000 mg | INHALATION_SOLUTION | Freq: Once | RESPIRATORY_TRACT | Status: AC
  Administered 2014-04-21: 2.5 mg via RESPIRATORY_TRACT

## 2014-04-21 NOTE — ED Provider Notes (Addendum)
CSN: [id number]     Arrival date & time 04/21/14  1636 History   First MD Initiated Contact with Patient 04/21/14 1708     Chief Complaint  Patient presents with  . Cough   (Consider location/radiation/quality/duration/timing/severity/associated sxs/prior Treatment) HPI Comments: 29 year old female with a known history of seasonal allergies is complaining of cough went to try to take a deep breath and some shortness of breath. She has previously had upper respiratory congestion and nasal congestion and PND but this has been partially treated with Zyrtec.   Past Medical History  Diagnosis Date  . No pertinent past medical history    Past Surgical History  Procedure Laterality Date  . Wisdom tooth extraction    . Tonsillectomy    . Laparoscopy  12/07/2011    Procedure: LAPAROSCOPY OPERATIVE;  Surgeon: Tereso Newcomer, MD;  Location: [facility];  Service: Gynecology;  Laterality: Right;  Right Salpingectomy, Lysis of Adhesions   Family History  Problem Relation Age of Onset  . Other Neg Hx    History  Substance Use Topics  . Smoking status: Current Some Day Smoker  . Smokeless tobacco: Never Used  . Alcohol Use: No   OB History    Gravida Para Term Preterm AB TAB SAB Ectopic Multiple Living       Review of Systems  Constitutional: Positive for activity change. Negative for fever and fatigue.  HENT: Positive for congestion, postnasal drip, rhinorrhea and sneezing. Negative for ear pain.   Eyes: Positive for discharge and itching.  Respiratory: Positive for cough, shortness of breath and wheezing.   Cardiovascular: Negative.   Gastrointestinal: Negative.   Genitourinary: Negative.     Allergies  Almond meal  Home Medications   Prior to Admission medications   Medication Sig Start Date End Date Taking? Authorizing Provider  albuterol (PROVENTIL HFA;VENTOLIN HFA) 108 (90 BASE) MCG/ACT inhaler Inhale 2 puffs into the lungs every 4 (four) hours as needed  for wheezing or shortness of breath. 04/21/14   Hayden Rasmussen, NP  norgestimate-ethinyl estradiol (SPRINTEC 28) 0.25-35 MG-MCG tablet Take 1 tablet by mouth daily. 07/13/13   Tereso Newcomer, MD  predniSONE (DELTASONE) 20 MG tablet Take 3 tabs po on first day, 2 tabs second day, 2 tabs third day, 1 tab fourth day, 1 tab 5th day. Take with food. 04/21/14   Hayden Rasmussen, NP   BP 139/90 mmHg  Pulse 91  Temp(Src) 99 F (37.2 C) (Oral)  Resp 18  SpO2 98% Physical Exam  Constitutional: She appears well-developed and well-nourished. No distress.  HENT:  Mouth/Throat: No oropharyngeal exudate.  Bilateral TMs are normal Oropharynx with minor erythema and small amount of clear PND.  Eyes: Conjunctivae and EOM are normal.  Neck: Normal range of motion. Neck supple.  Cardiovascular: Normal rate, regular rhythm and normal heart sounds.   Pulmonary/Chest: Effort normal.  Inspiratory and expiratory wheezes with prolonged expiratory phase.  Musculoskeletal: She exhibits no edema.  Lymphadenopathy:    She has no cervical adenopathy.  Neurological: She is alert. She exhibits normal muscle tone.  Skin: Skin is warm and dry.  Psychiatric: She has a normal mood and affect.  Nursing note and vitals reviewed.   ED Course  Procedures (including critical care time) Labs Review Labs Reviewed - No data to display  Imaging Review No results found.   MDM   1. Allergic rhinitis due to pollen   2. RAD (reactive airway disease)  with wheezing, mild intermittent, with acute exacerbation    Patient received an albuterol DuoNeb 5/2.5 She will receive albuterol inhaler and prednisone taper pack for home. Continue your OTC allergy medications. Kenalog 40 mg IM    Hayden Rasmussenavid Jessup Ogas, NP 04/21/14 Paulo [id number]  Hayden Rasmussenavid Nancyjo Givhan, NP 04/22/14 [phone]

## 2014-04-21 NOTE — Discharge Instructions (Signed)
Allergic Rhinitis Allergic rhinitis is when the mucous membranes in the nose respond to allergens. Allergens are particles in the air that cause your body to have an allergic reaction. This causes you to release allergic antibodies. Through a chain of events, these eventually cause you to release histamine into the blood stream. Although meant to protect the body, it is this release of histamine that causes your discomfort, such as frequent sneezing, congestion, and an itchy, runny nose.  CAUSES  Seasonal allergic rhinitis (hay fever) is caused by pollen allergens that may come from grasses, trees, and weeds. Year-round allergic rhinitis (perennial allergic rhinitis) is caused by allergens such as house dust mites, pet dander, and mold spores.  SYMPTOMS   Nasal stuffiness (congestion).  Itchy, runny nose with sneezing and tearing of the eyes. DIAGNOSIS  Your health care provider can help you determine the allergen or allergens that trigger your symptoms. If you and your health care provider are unable to determine the allergen, skin or blood testing may be used. TREATMENT  Allergic rhinitis does not have a cure, but it can be controlled by:  Medicines and allergy shots (immunotherapy).  Avoiding the allergen. Hay fever may often be treated with antihistamines in pill or nasal spray forms. Antihistamines block the effects of histamine. There are over-the-counter medicines that may help with nasal congestion and swelling around the eyes. Check with your health care provider before taking or giving this medicine.  If avoiding the allergen or the medicine prescribed do not work, there are many new medicines your health care provider can prescribe. Stronger medicine may be used if initial measures are ineffective. Desensitizing injections can be used if medicine and avoidance does not work. Desensitization is when a patient is given ongoing shots until the body becomes less sensitive to the allergen.  Make sure you follow up with your health care provider if problems continue. HOME CARE INSTRUCTIONS It is not possible to completely avoid allergens, but you can reduce your symptoms by taking steps to limit your exposure to them. It helps to know exactly what you are allergic to so that you can avoid your specific triggers. SEEK MEDICAL CARE IF:   You have a fever.  You develop a cough that does not stop easily (persistent).  You have shortness of breath.  You start wheezing.  Symptoms interfere with normal daily activities. Document Released: 09/26/2000 Document Revised: 01/06/2013 Document Reviewed: 09/08/2012 [facility] Patient Information 2015 [place], [place]. This information is not intended to replace advice given to you by your health care provider. Make sure you discuss any questions you have with your health care provider.  Bronchospasm A bronchospasm is a spasm or tightening of the airways going into the lungs. During a bronchospasm breathing becomes more difficult because the airways get smaller. When this happens there can be coughing, a whistling sound when breathing (wheezing), and difficulty breathing. Bronchospasm is often associated with asthma, but not all patients who experience a bronchospasm have asthma. CAUSES  A bronchospasm is caused by inflammation or irritation of the airways. The inflammation or irritation may be triggered by:   Allergies (such as to animals, pollen, food, or mold). Allergens that cause bronchospasm may cause wheezing immediately after exposure or many hours later.   Infection. Viral infections are believed to be the most common cause of bronchospasm.   Exercise.   Irritants (such as pollution, cigarette smoke, strong odors, aerosol sprays, and paint fumes).   Weather changes. Winds increase molds and pollens in  the air. Rain refreshes the air by washing irritants out. Cold air may cause inflammation.   Stress and emotional upset.   SIGNS AND SYMPTOMS   Wheezing.   Excessive nighttime coughing.   Frequent or severe coughing with a simple cold.   Chest tightness.   Shortness of breath.  DIAGNOSIS  Bronchospasm is usually diagnosed through a history and physical exam. Tests, such as chest X-rays, are sometimes done to look for other conditions. TREATMENT   Inhaled medicines can be given to open up your airways and help you breathe. The medicines can be given using either an inhaler or a nebulizer machine.  Corticosteroid medicines may be given for severe bronchospasm, usually when it is associated with asthma. HOME CARE INSTRUCTIONS   Always have a plan prepared for seeking medical care. Know when to call your health care provider and local emergency services (911 in the [place] Know where you can access local emergency care.  Only take medicines as directed by your health care provider.  If you were prescribed an inhaler or nebulizer machine, ask your health care provider to explain how to use it correctly. Always use a spacer with your inhaler if you were given one.  It is necessary to remain calm during an attack. Try to relax and breathe more slowly.  Control your home environment in the following ways:   Change your heating and air conditioning filter at least once a month.   Limit your use of fireplaces and wood stoves.  Do not smoke and do not allow smoking in your home.   Avoid exposure to perfumes and fragrances.   Get rid of pests (such as roaches and mice) and their droppings.   Throw away plants if you see mold on them.   Keep your house clean and dust free.   Replace carpet with wood, tile, or vinyl flooring. Carpet can trap dander and dust.   Use allergy-proof pillows, mattress covers, and box spring covers.   Wash bed sheets and blankets every week in hot water and dry them in a dryer.   Use blankets that are made of polyester or cotton.   Wash hands  frequently. SEEK MEDICAL CARE IF:   You have muscle aches.   You have chest pain.   The sputum changes from clear or white to yellow, green, gray, or bloody.   The sputum you cough up gets thicker.   There are problems that may be related to the medicine you are given, such as a rash, itching, swelling, or trouble breathing.  SEEK IMMEDIATE MEDICAL CARE IF:   You have worsening wheezing and coughing even after taking your prescribed medicines.   You have increased difficulty breathing.   You develop severe chest pain. MAKE SURE YOU:   Understand these instructions.  Will watch your condition.  Will get help right away if you are not doing well or get worse. Document Released: 01/04/2003 Document Revised: 01/06/2013 Document Reviewed: 06/23/2012 [facility] Patient Information 2015 [place]. This information is not intended to replace advice given to you by your health care provider. Make sure you discuss any questions you have with your health care provider.

## 2014-04-21 NOTE — ED Notes (Signed)
C/o cough, allergies bothering her. Minimal relief w zyrtec. NAD

## 2014-05-20 ENCOUNTER — Telehealth: Payer: Self-pay

## 2014-05-20 DIAGNOSIS — Z3041 Encounter for surveillance of contraceptive pills: Secondary | ICD-10-CM

## 2014-05-20 MED ORDER — NORGESTIMATE-ETH ESTRADIOL 0.25-35 MG-MCG PO TABS: 1.0000 | ORAL_TABLET | Freq: Every day | ORAL | Status: DC

## 2014-05-20 NOTE — Telephone Encounter (Signed)
Patient called requesting refill of OCPs-- reports she is on her last pack now and needs refill until next annual appointment scheduled for 07/14/14. Medication refilled per protocol. Called patient and left message informing her medication has been refilled.

## 2014-07-14 ENCOUNTER — Encounter: Payer: Self-pay | Admitting: Obstetrics & Gynecology

## 2014-07-14 ENCOUNTER — Ambulatory Visit (INDEPENDENT_AMBULATORY_CARE_PROVIDER_SITE_OTHER): Payer: 59 | Admitting: Obstetrics & Gynecology

## 2014-07-14 VITALS — BP 147/105 | HR 66 | Temp 98.6°F | Ht 68.0 in | Wt 265.5 lb

## 2014-07-14 DIAGNOSIS — Z01419 Encounter for gynecological examination (general) (routine) without abnormal findings: Secondary | ICD-10-CM | POA: Diagnosis not present

## 2014-07-14 DIAGNOSIS — I10 Essential (primary) hypertension: Secondary | ICD-10-CM | POA: Diagnosis not present

## 2014-07-14 DIAGNOSIS — Z113 Encounter for screening for infections with a predominantly sexual mode of transmission: Secondary | ICD-10-CM | POA: Diagnosis not present

## 2014-07-14 DIAGNOSIS — Z124 Encounter for screening for malignant neoplasm of cervix: Secondary | ICD-10-CM | POA: Diagnosis not present

## 2014-07-14 DIAGNOSIS — Z118 Encounter for screening for other infectious and parasitic diseases: Secondary | ICD-10-CM

## 2014-07-14 DIAGNOSIS — Z3041 Encounter for surveillance of contraceptive pills: Secondary | ICD-10-CM | POA: Diagnosis not present

## 2014-07-14 MED ORDER — HYDROCHLOROTHIAZIDE 25 MG PO TABS: 25.0000 mg | ORAL_TABLET | Freq: Every day | ORAL | Status: DC

## 2014-07-14 MED ORDER — NORGESTIMATE-ETH ESTRADIOL 0.25-35 MG-MCG PO TABS: 1.0000 | ORAL_TABLET | Freq: Every day | ORAL | Status: DC

## 2014-07-14 NOTE — Progress Notes (Signed)
GYNECOLOGY CLINIC ANNUAL PREVENTATIVE CARE ENCOUNTER NOTE  Subjective:   Angel Thomas is a 29 y.o. 811P0010 female here for a routine annual gynecologic exam. Current complaints: none. Desires OCP refill  and STI testing.   Denies abnormal vaginal bleeding, discharge, problems with intercourse or other gynecologic concerns.    Gynecologic History Patient's last menstrual period was 06/29/2014 (exact date). Contraception: OCP (estrogen/progesterone) Last Pap: 07/13/2013. Results were: normal  Obstetric History OB History  Gravida Para Term Preterm AB SAB TAB Ectopic Multiple Living  1 0 0 0 1 0 0 1 0 0     # Outcome Date GA Lbr Len/2nd Weight Sex Delivery Anes PTL Lv  1 Ectopic              Comments: System Generated. Please review and update pregnancy details.      Past Medical History  Diagnosis Date  . No pertinent past medical history     Past Surgical History  Procedure Laterality Date  . Wisdom tooth extraction    . Tonsillectomy    . Laparoscopy  12/07/2011    Procedure: LAPAROSCOPY OPERATIVE;  Surgeon: Tereso NewcomerUgonna A Payten Beaumier, MD;  Location: [facility];  Service: Gynecology;  Laterality: Right;  Right Salpingectomy, Lysis of Adhesions    Current Outpatient Prescriptions on File Prior to Visit  Medication Sig Dispense Refill  . albuterol (PROVENTIL HFA;VENTOLIN HFA) 108 (90 BASE) MCG/ACT inhaler Inhale 2 puffs into the lungs every 4 (four) hours as needed for wheezing or shortness of breath. 1 Inhaler 0  . predniSONE (DELTASONE) 20 MG tablet Take 3 tabs po on first day, 2 tabs second day, 2 tabs third day, 1 tab fourth day, 1 tab 5th day. Take with food. 9 tablet 0   No current facility-administered medications on file prior to visit.    Allergies  Allergen Reactions  . Almond Meal     Throat itches and tongue becomes tingly    History   Social History  . Marital Status: Single    Spouse Name: N/A  . Number of Children: N/A  . Years of Education: N/A    Occupational History  . Not on file.   Social History Main Topics  . Smoking status: Former Smoker    Quit date: 01/15/2014  . Smokeless tobacco: Never Used  . Alcohol Use: No  . Drug Use: No  . Sexual Activity: Yes    Birth Control/ Protection: None   Other Topics Concern  . Not on file   Social History Narrative    Family History  Problem Relation Age of Onset  . Other Neg Hx     The following portions of the patient's history were reviewed and updated as appropriate: allergies, current medications, past family history, past medical history, past social history, past surgical history and problem list.  Review of Systems A comprehensive review of systems was negative.   Objective:  BP 147/105 mmHg  Pulse 66  Temp(Src) 98.6 F (37 C) (Oral)  Ht 5\' 8"  (1.727 m)  Wt 265 lb 8 oz (120.43 kg)  BMI 40.38 kg/m2  LMP 06/29/2014 (Exact Date) CONSTITUTIONAL: Well-developed, well-nourished female in no acute distress.  HENT:  Normocephalic, atraumatic, External right and left ear normal. Oropharynx is clear and moist EYES: Conjunctivae and EOM are normal. Pupils are equal, round, and reactive to light. No scleral icterus.  NECK: Normal range of motion, supple, no masses.  Normal thyroid.  SKIN: Skin is warm and dry. No rash noted. Not diaphoretic.  No erythema. No pallor. NEUROLGIC: Alert and oriented to person, place, and time. Normal reflexes, muscle tone coordination. No cranial nerve deficit noted. PSYCHIATRIC: Normal mood and affect. Normal behavior. Normal judgment and thought content. CARDIOVASCULAR: Normal heart rate noted, regular rhythm RESPIRATORY: Clear to auscultation bilaterally. Effort and breath sounds normal, no problems with respiration noted. BREASTS: Symmetric in size. No masses, skin changes, nipple drainage, or lymphadenopathy. ABDOMEN: Soft, normal bowel sounds, no distention noted.  No tenderness, rebound or guarding.  PELVIC: Normal appearing external  genitalia; normal appearing vaginal mucosa and cervix.  No abnormal discharge noted.  Pap smear obtained.  Normal uterine size, no other palpable masses, no uterine or adnexal tenderness. MUSCULOSKELETAL: Normal range of motion. No tenderness.  No cyanosis, clubbing, or edema.  2+ distal pulses.   Assessment:  Annual gynecologic examination with pap smear Hypertension Contraception management   Plan:  Will follow up results of pap smear and manage accordingly. Discussed increased risk of cardiovascular disease and thromboembolic disease with HTN and combined OCPs. Discussed progestin only methods; patient wants to continue her regular OCPs. She will follow up with [facility] and [facility]; in the meantime she was prescribed HCTZ 25 mg po daily. Routine preventative health maintenance measures emphasized. Please refer to After Visit Summary for other counseling recommendations.    Jaynie Collins, MD, FACOG Attending Obstetrician & Gynecologist [facility] for [employer], [facility]

## 2014-07-14 NOTE — Patient Instructions (Signed)
[facility] [phone]   Preventive Care for Adults A healthy lifestyle and preventive care can promote health and wellness. Preventive health guidelines for women include the following key practices.  A routine yearly physical is a good way to check with your health care provider about your health and preventive screening. It is a chance to share any concerns and updates on your health and to receive a thorough exam.  Visit your dentist for a routine exam and preventive care every 6 months. Brush your teeth twice a day and floss once a day. Good oral hygiene prevents tooth decay and gum disease.  The frequency of eye exams is based on your age, health, family medical history, use of contact lenses, and other factors. Follow your health care provider's recommendations for frequency of eye exams.  Eat a healthy diet. Foods like vegetables, fruits, whole grains, low-fat dairy products, and lean protein foods contain the nutrients you need without too many calories. Decrease your intake of foods high in solid fats, added sugars, and salt. Eat the right amount of calories for you.Get information about a proper diet from your health care provider, if necessary.  Regular physical exercise is one of the most important things you can do for your health. Most adults should get at least 150 minutes of moderate-intensity exercise (any activity that increases your heart rate and causes you to sweat) each week. In addition, most adults need muscle-strengthening exercises on 2 or more days a week.  Maintain a healthy weight. The body mass index (BMI) is a screening tool to identify possible weight problems. It provides an estimate of body fat based on height and weight. Your health care provider can find your BMI and can help you achieve or maintain a healthy weight.For adults 20 years and older:  A BMI below 18.5 is considered underweight.  A BMI of 18.5 to 24.9 is normal.  A BMI of 25  to 29.9 is considered overweight.  A BMI of 30 and above is considered obese.  Maintain normal blood lipids and cholesterol levels by exercising and minimizing your intake of saturated fat. Eat a balanced diet with plenty of fruit and vegetables. Blood tests for lipids and cholesterol should begin at age 25 and be repeated every 5 years. If your lipid or cholesterol levels are high, you are over 50, or you are at high risk for heart disease, you may need your cholesterol levels checked more frequently.Ongoing high lipid and cholesterol levels should be treated with medicines if diet and exercise are not working.  If you smoke, find out from your health care provider how to quit. If you do not use tobacco, do not start.  Lung cancer screening is recommended for adults aged 16-80 years who are at high risk for developing lung cancer because of a history of smoking. A yearly low-dose CT scan of the lungs is recommended for people who have at least a 30-pack-year history of smoking and are a current smoker or have quit within the past 15 years. A pack year of smoking is smoking an average of 1 pack of cigarettes a day for 1 year (for example: 1 pack a day for 30 years or 2 packs a day for 15 years). Yearly screening should continue until the smoker has stopped smoking for at least 15 years. Yearly screening should be stopped for people who develop a health problem that would prevent them from having lung cancer treatment.  If you are pregnant, do  not drink alcohol. If you are breastfeeding, be very cautious about drinking alcohol. If you are not pregnant and choose to drink alcohol, do not have more than 1 drink per day. One drink is considered to be 12 ounces (355 mL) of beer, 5 ounces (148 mL) of wine, or 1.5 ounces (44 mL) of liquor.  Avoid use of street drugs. Do not share needles with anyone. Ask for help if you need support or instructions about stopping the use of drugs.  High blood pressure causes  heart disease and increases the risk of stroke. Your blood pressure should be checked at least every 1 to 2 years. Ongoing high blood pressure should be treated with medicines if weight loss and exercise do not work.  If you are 50-25 years old, ask your health care provider if you should take aspirin to prevent strokes.  Diabetes screening involves taking a blood sample to check your fasting blood sugar level. This should be done once every 3 years, after age 13, if you are within normal weight and without risk factors for diabetes. Testing should be considered at a younger age or be carried out more frequently if you are overweight and have at least 1 risk factor for diabetes.  Breast cancer screening is essential preventive care for women. You should practice "breast self-awareness." This means understanding the normal appearance and feel of your breasts and may include breast self-examination. Any changes detected, no matter how small, should be reported to a health care provider. Women in their 49s and 30s should have a clinical breast exam (CBE) by a health care provider as part of a regular health exam every 1 to 3 years. After age 37, women should have a CBE every year. Starting at age 5, women should consider having a mammogram (breast X-ray test) every year. Women who have a family history of breast cancer should talk to their health care provider about genetic screening. Women at a high risk of breast cancer should talk to their health care providers about having an MRI and a mammogram every year.  Breast cancer gene (BRCA)-related cancer risk assessment is recommended for women who have family members with BRCA-related cancers. BRCA-related cancers include breast, ovarian, tubal, and peritoneal cancers. Having family members with these cancers may be associated with an increased risk for harmful changes (mutations) in the breast cancer genes BRCA1 and BRCA2. Results of the assessment will  determine the need for genetic counseling and BRCA1 and BRCA2 testing.  Routine pelvic exams to screen for cancer are no longer recommended for nonpregnant women who are considered low risk for cancer of the pelvic organs (ovaries, uterus, and vagina) and who do not have symptoms. Ask your health care provider if a screening pelvic exam is right for you.  If you have had past treatment for cervical cancer or a condition that could lead to cancer, you need Pap tests and screening for cancer for at least 20 years after your treatment. If Pap tests have been discontinued, your risk factors (such as having a new sexual partner) need to be reassessed to determine if screening should be resumed. Some women have medical problems that increase the chance of getting cervical cancer. In these cases, your health care provider may recommend more frequent screening and Pap tests.  The HPV test is an additional test that may be used for cervical cancer screening. The HPV test looks for the virus that can cause the cell changes on the cervix. The cells  collected during the Pap test can be tested for HPV. The HPV test could be used to screen women aged 32 years and older, and should be used in women of any age who have unclear Pap test results. After the age of 57, women should have HPV testing at the same frequency as a Pap test.  Colorectal cancer can be detected and often prevented. Most routine colorectal cancer screening begins at the age of 64 years and continues through age 65 years. However, your health care provider may recommend screening at an earlier age if you have risk factors for colon cancer. On a yearly basis, your health care provider may provide home test kits to check for hidden blood in the stool. Use of a small camera at the end of a tube, to directly examine the colon (sigmoidoscopy or colonoscopy), can detect the earliest forms of colorectal cancer. Talk to your health care provider about this at age  24, when routine screening begins. Direct exam of the colon should be repeated every 5-10 years through age 59 years, unless early forms of pre-cancerous polyps or small growths are found.  People who are at an increased risk for hepatitis B should be screened for this virus. You are considered at high risk for hepatitis B if:  You were born in a country where hepatitis B occurs often. Talk with your health care provider about which countries are considered high risk.  Your parents were born in a high-risk country and you have not received a shot to protect against hepatitis B (hepatitis B vaccine).  You have HIV or AIDS.  You use needles to inject street drugs.  You live with, or have sex with, someone who has hepatitis B.  You get hemodialysis treatment.  You take certain medicines for conditions like cancer, organ transplantation, and autoimmune conditions.  Hepatitis C blood testing is recommended for all people born from [birth year] through 1965 and any individual with known risks for hepatitis C.  Practice safe sex. Use condoms and avoid high-risk sexual practices to reduce the spread of sexually transmitted infections (STIs). STIs include gonorrhea, chlamydia, syphilis, trichomonas, herpes, HPV, and human immunodeficiency virus (HIV). Herpes, HIV, and HPV are viral illnesses that have no cure. They can result in disability, cancer, and death.  You should be screened for sexually transmitted illnesses (STIs) including gonorrhea and chlamydia if:  You are sexually active and are younger than 24 years.  You are older than 24 years and your health care provider tells you that you are at risk for this type of infection.  Your sexual activity has changed since you were last screened and you are at an increased risk for chlamydia or gonorrhea. Ask your health care provider if you are at risk.  If you are at risk of being infected with HIV, it is recommended that you take a prescription  medicine daily to prevent HIV infection. This is called preexposure prophylaxis (PrEP). You are considered at risk if:  You are a heterosexual woman, are sexually active, and are at increased risk for HIV infection.  You take drugs by injection.  You are sexually active with a partner who has HIV.  Talk with your health care provider about whether you are at high risk of being infected with HIV. If you choose to begin PrEP, you should first be tested for HIV. You should then be tested every 3 months for as long as you are taking PrEP.  Osteoporosis is a disease in  which the bones lose minerals and strength with aging. This can result in serious bone fractures or breaks. The risk of osteoporosis can be identified using a bone density scan. Women ages 75 years and over and women at risk for fractures or osteoporosis should discuss screening with their health care providers. Ask your health care provider whether you should take a calcium supplement or vitamin D to reduce the rate of osteoporosis.  Menopause can be associated with physical symptoms and risks. Hormone replacement therapy is available to decrease symptoms and risks. You should talk to your health care provider about whether hormone replacement therapy is right for you.  Use sunscreen. Apply sunscreen liberally and repeatedly throughout the day. You should seek shade when your shadow is shorter than you. Protect yourself by wearing long sleeves, pants, a wide-brimmed hat, and sunglasses year round, whenever you are outdoors.  Once a month, do a whole body skin exam, using a mirror to look at the skin on your back. Tell your health care provider of new moles, moles that have irregular borders, moles that are larger than a pencil eraser, or moles that have changed in shape or color.  Stay current with required vaccines (immunizations).  Influenza vaccine. All adults should be immunized every year.  Tetanus, diphtheria, and acellular  pertussis (Td, Tdap) vaccine. Pregnant women should receive 1 dose of Tdap vaccine during each pregnancy. The dose should be obtained regardless of the length of time since the last dose. Immunization is preferred during the 27th-36th week of gestation. An adult who has not previously received Tdap or who does not know her vaccine status should receive 1 dose of Tdap. This initial dose should be followed by tetanus and diphtheria toxoids (Td) booster doses every 10 years. Adults with an unknown or incomplete history of completing a 3-dose immunization series with Td-containing vaccines should begin or complete a primary immunization series including a Tdap dose. Adults should receive a Td booster every 10 years.  Varicella vaccine. An adult without evidence of immunity to varicella should receive 2 doses or a second dose if she has previously received 1 dose. Pregnant females who do not have evidence of immunity should receive the first dose after pregnancy. This first dose should be obtained before leaving the health care facility. The second dose should be obtained 4-8 weeks after the first dose.  Human papillomavirus (HPV) vaccine. Females aged 13-26 years who have not received the vaccine previously should obtain the 3-dose series. The vaccine is not recommended for use in pregnant females. However, pregnancy testing is not needed before receiving a dose. If a female is found to be pregnant after receiving a dose, no treatment is needed. In that case, the remaining doses should be delayed until after the pregnancy. Immunization is recommended for any person with an immunocompromised condition through the age of 41 years if she did not get any or all doses earlier. During the 3-dose series, the second dose should be obtained 4-8 weeks after the first dose. The third dose should be obtained 24 weeks after the first dose and 16 weeks after the second dose.  Zoster vaccine. One dose is recommended for adults  aged 61 years or older unless certain conditions are present.  Measles, mumps, and rubella (MMR) vaccine. Adults born before [birth year] generally are considered immune to measles and mumps. Adults born in [birth year] or later should have 1 or more doses of MMR vaccine unless there is a contraindication to the vaccine or  there is laboratory evidence of immunity to each of the three diseases. A routine second dose of MMR vaccine should be obtained at least 28 days after the first dose for students attending postsecondary schools, health care workers, or international travelers. People who received inactivated measles vaccine or an unknown type of measles vaccine during 1963-1967 should receive 2 doses of MMR vaccine. People who received inactivated mumps vaccine or an unknown type of mumps vaccine before 1979 and are at high risk for mumps infection should consider immunization with 2 doses of MMR vaccine. For females of childbearing age, rubella immunity should be determined. If there is no evidence of immunity, females who are not pregnant should be vaccinated. If there is no evidence of immunity, females who are pregnant should delay immunization until after pregnancy. Unvaccinated health care workers born before [birth year] who lack laboratory evidence of measles, mumps, or rubella immunity or laboratory confirmation of disease should consider measles and mumps immunization with 2 doses of MMR vaccine or rubella immunization with 1 dose of MMR vaccine.  Pneumococcal 13-valent conjugate (PCV13) vaccine. When indicated, a person who is uncertain of her immunization history and has no record of immunization should receive the PCV13 vaccine. An adult aged 5 years or older who has certain medical conditions and has not been previously immunized should receive 1 dose of PCV13 vaccine. This PCV13 should be followed with a dose of pneumococcal polysaccharide (PPSV23) vaccine. The PPSV23 vaccine dose should be obtained at least 8 weeks  after the dose of PCV13 vaccine. An adult aged 68 years or older who has certain medical conditions and previously received 1 or more doses of PPSV23 vaccine should receive 1 dose of PCV13. The PCV13 vaccine dose should be obtained 1 or more years after the last PPSV23 vaccine dose.  Pneumococcal polysaccharide (PPSV23) vaccine. When PCV13 is also indicated, PCV13 should be obtained first. All adults aged 58 years and older should be immunized. An adult younger than age 25 years who has certain medical conditions should be immunized. Any person who resides in a nursing home or long-term care facility should be immunized. An adult smoker should be immunized. People with an immunocompromised condition and certain other conditions should receive both PCV13 and PPSV23 vaccines. People with human immunodeficiency virus (HIV) infection should be immunized as soon as possible after diagnosis. Immunization during chemotherapy or radiation therapy should be avoided. Routine use of PPSV23 vaccine is not recommended for American Indians, [place] Natives, or people younger than 65 years unless there are medical conditions that require PPSV23 vaccine. When indicated, people who have unknown immunization and have no record of immunization should receive PPSV23 vaccine. One-time revaccination 5 years after the first dose of PPSV23 is recommended for people aged 19-64 years who have chronic kidney failure, nephrotic syndrome, asplenia, or immunocompromised conditions. People who received 1-2 doses of PPSV23 before age 64 years should receive another dose of PPSV23 vaccine at age 39 years or later if at least 5 years have passed since the previous dose. Doses of PPSV23 are not needed for people immunized with PPSV23 at or after age 13 years.  Meningococcal vaccine. Adults with asplenia or persistent complement component deficiencies should receive 2 doses of quadrivalent meningococcal conjugate (MenACWY-D) vaccine. The doses  should be obtained at least 2 months apart. Microbiologists working with certain meningococcal bacteria, [place] recruits, people at risk during an outbreak, and people who travel to or live in countries with a high rate of meningitis should be immunized.  A first-year college student up through age 71 years who is living in a residence hall should receive a dose if she did not receive a dose on or after her 16th birthday. Adults who have certain high-risk conditions should receive one or more doses of vaccine.  Hepatitis A vaccine. Adults who wish to be protected from this disease, have certain high-risk conditions, work with hepatitis A-infected animals, work in hepatitis A research labs, or travel to or work in countries with a high rate of hepatitis A should be immunized. Adults who were previously unvaccinated and who anticipate close contact with an international adoptee during the first 60 days after arrival in the [place] from a country with a high rate of hepatitis A should be immunized.  Hepatitis B vaccine. Adults who wish to be protected from this disease, have certain high-risk conditions, may be exposed to blood or other infectious body fluids, are household contacts or sex partners of hepatitis B positive people, are clients or workers in certain care facilities, or travel to or work in countries with a high rate of hepatitis B should be immunized.  Haemophilus influenzae type b (Hib) vaccine. A previously unvaccinated person with asplenia or sickle cell disease or having a scheduled splenectomy should receive 1 dose of Hib vaccine. Regardless of previous immunization, a recipient of a hematopoietic stem cell transplant should receive a 3-dose series 6-12 months after her successful transplant. Hib vaccine is not recommended for adults with HIV infection. Preventive Services / Frequency Ages 67 to 57 years  Blood pressure check.** / Every 1 to 2 years.  Lipid and cholesterol check.**  / Every 5 years beginning at age 79.  Clinical breast exam.** / Every 3 years for women in their 25s and 39s.  BRCA-related cancer risk assessment.** / For women who have family members with a BRCA-related cancer (breast, ovarian, tubal, or peritoneal cancers).  Pap test.** / Every 2 years from ages 36 through 44. Every 3 years starting at age 69 through age 25 or 73 with a history of 3 consecutive normal Pap tests.  HPV screening.** / Every 3 years from ages 51 through ages 39 to 38 with a history of 3 consecutive normal Pap tests.  Hepatitis C blood test.** / For any individual with known risks for hepatitis C.  Skin self-exam. / Monthly.  Influenza vaccine. / Every year.  Tetanus, diphtheria, and acellular pertussis (Tdap, Td) vaccine.** / Consult your health care provider. Pregnant women should receive 1 dose of Tdap vaccine during each pregnancy. 1 dose of Td every 10 years.  Varicella vaccine.** / Consult your health care provider. Pregnant females who do not have evidence of immunity should receive the first dose after pregnancy.  HPV vaccine. / 3 doses over 6 months, if 54 and younger. The vaccine is not recommended for use in pregnant females. However, pregnancy testing is not needed before receiving a dose.  Measles, mumps, rubella (MMR) vaccine.** / You need at least 1 dose of MMR if you were born in 1957 or later. You may also need a 2nd dose. For females of childbearing age, rubella immunity should be determined. If there is no evidence of immunity, females who are not pregnant should be vaccinated. If there is no evidence of immunity, females who are pregnant should delay immunization until after pregnancy.  Pneumococcal 13-valent conjugate (PCV13) vaccine.** / Consult your health care provider.  Pneumococcal polysaccharide (PPSV23) vaccine.** / 1 to 2 doses if you smoke cigarettes or if  you have certain conditions.  Meningococcal vaccine.** / 1 dose if you are age 44 to 29  years and a Market researcher living in a residence hall, or have one of several medical conditions, you need to get vaccinated against meningococcal disease. You may also need additional booster doses.  Hepatitis A vaccine.** / Consult your health care provider.  Hepatitis B vaccine.** / Consult your health care provider.  Haemophilus influenzae type b (Hib) vaccine.** / Consult your health care provider. Ages 23 to 78 years  Blood pressure check.** / Every 1 to 2 years.  Lipid and cholesterol check.** / Every 5 years beginning at age 72 years.  Lung cancer screening. / Every year if you are aged 28-80 years and have a 30-pack-year history of smoking and currently smoke or have quit within the past 15 years. Yearly screening is stopped once you have quit smoking for at least 15 years or develop a health problem that would prevent you from having lung cancer treatment.  Clinical breast exam.** / Every year after age 34 years.  BRCA-related cancer risk assessment.** / For women who have family members with a BRCA-related cancer (breast, ovarian, tubal, or peritoneal cancers).  Mammogram.** / Every year beginning at age 30 years and continuing for as long as you are in good health. Consult with your health care provider.  Pap test.** / Every 3 years starting at age 20 years through age 74 or 60 years with a history of 3 consecutive normal Pap tests.  HPV screening.** / Every 3 years from ages 12 years through ages 62 to 22 years with a history of 3 consecutive normal Pap tests.  Fecal occult blood test (FOBT) of stool. / Every year beginning at age 65 years and continuing until age 64 years. You may not need to do this test if you get a colonoscopy every 10 years.  Flexible sigmoidoscopy or colonoscopy.** / Every 5 years for a flexible sigmoidoscopy or every 10 years for a colonoscopy beginning at age 29 years and continuing until age 56 years.  Hepatitis C blood test.** / For  all people born from [birth year] through 1963 and any individual with known risks for hepatitis C.  Skin self-exam. / Monthly.  Influenza vaccine. / Every year.  Tetanus, diphtheria, and acellular pertussis (Tdap/Td) vaccine.** / Consult your health care provider. Pregnant women should receive 1 dose of Tdap vaccine during each pregnancy. 1 dose of Td every 10 years.  Varicella vaccine.** / Consult your health care provider. Pregnant females who do not have evidence of immunity should receive the first dose after pregnancy.  Zoster vaccine.** / 1 dose for adults aged 39 years or older.  Measles, mumps, rubella (MMR) vaccine.** / You need at least 1 dose of MMR if you were born in 1957 or later. You may also need a 2nd dose. For females of childbearing age, rubella immunity should be determined. If there is no evidence of immunity, females who are not pregnant should be vaccinated. If there is no evidence of immunity, females who are pregnant should delay immunization until after pregnancy.  Pneumococcal 13-valent conjugate (PCV13) vaccine.** / Consult your health care provider.  Pneumococcal polysaccharide (PPSV23) vaccine.** / 1 to 2 doses if you smoke cigarettes or if you have certain conditions.  Meningococcal vaccine.** / Consult your health care provider.  Hepatitis A vaccine.** / Consult your health care provider.  Hepatitis B vaccine.** / Consult your health care provider.  Haemophilus influenzae type b (Hib) vaccine.** /  Consult your health care provider. Ages 11 years and over  Blood pressure check.** / Every 1 to 2 years.  Lipid and cholesterol check.** / Every 5 years beginning at age 27 years.  Lung cancer screening. / Every year if you are aged 59-80 years and have a 30-pack-year history of smoking and currently smoke or have quit within the past 15 years. Yearly screening is stopped once you have quit smoking for at least 15 years or develop a health problem that would prevent  you from having lung cancer treatment.  Clinical breast exam.** / Every year after age 45 years.  BRCA-related cancer risk assessment.** / For women who have family members with a BRCA-related cancer (breast, ovarian, tubal, or peritoneal cancers).  Mammogram.** / Every year beginning at age 46 years and continuing for as long as you are in good health. Consult with your health care provider.  Pap test.** / Every 3 years starting at age 58 years through age 57 or 103 years with 3 consecutive normal Pap tests. Testing can be stopped between 65 and 70 years with 3 consecutive normal Pap tests and no abnormal Pap or HPV tests in the past 10 years.  HPV screening.** / Every 3 years from ages 53 years through ages 90 or 44 years with a history of 3 consecutive normal Pap tests. Testing can be stopped between 65 and 70 years with 3 consecutive normal Pap tests and no abnormal Pap or HPV tests in the past 10 years.  Fecal occult blood test (FOBT) of stool. / Every year beginning at age 37 years and continuing until age 76 years. You may not need to do this test if you get a colonoscopy every 10 years.  Flexible sigmoidoscopy or colonoscopy.** / Every 5 years for a flexible sigmoidoscopy or every 10 years for a colonoscopy beginning at age 7 years and continuing until age 19 years.  Hepatitis C blood test.** / For all people born from [birth year] through 1963 and any individual with known risks for hepatitis C.  Osteoporosis screening.** / A one-time screening for women ages 35 years and over and women at risk for fractures or osteoporosis.  Skin self-exam. / Monthly.  Influenza vaccine. / Every year.  Tetanus, diphtheria, and acellular pertussis (Tdap/Td) vaccine.** / 1 dose of Td every 10 years.  Varicella vaccine.** / Consult your health care provider.  Zoster vaccine.** / 1 dose for adults aged 41 years or older.  Pneumococcal 13-valent conjugate (PCV13) vaccine.** / Consult your health care  provider.  Pneumococcal polysaccharide (PPSV23) vaccine.** / 1 dose for all adults aged 69 years and older.  Meningococcal vaccine.** / Consult your health care provider.  Hepatitis A vaccine.** / Consult your health care provider.  Hepatitis B vaccine.** / Consult your health care provider.  Haemophilus influenzae type b (Hib) vaccine.** / Consult your health care provider. ** Family history and personal history of risk and conditions may change your health care provider's recommendations. Document Released: 02/27/2001 Document Revised: 05/18/2013 Document Reviewed: 05/29/2010 [facility] Patient Information 2015 [place], [place]. This information is not intended to replace advice given to you by your health care provider. Make sure you discuss any questions you have with your health care provider.

## 2014-07-15 LAB — HEPATITIS B SURFACE ANTIGEN: Hepatitis B Surface Ag: NEGATIVE

## 2014-07-15 LAB — RPR

## 2014-07-15 LAB — HIV ANTIBODY (ROUTINE TESTING W REFLEX): HIV 1&2 Ab, 4th Generation: NONREACTIVE

## 2014-07-15 LAB — CYTOLOGY - PAP

## 2014-07-15 LAB — HEPATITIS C ANTIBODY: HCV AB: NEGATIVE

## 2015-06-11 ENCOUNTER — Other Ambulatory Visit: Payer: Self-pay | Admitting: Obstetrics & Gynecology

## 2015-07-07 ENCOUNTER — Other Ambulatory Visit: Payer: Self-pay | Admitting: Obstetrics and Gynecology

## 2015-08-04 ENCOUNTER — Ambulatory Visit: Payer: 59 | Admitting: Obstetrics & Gynecology

## 2015-08-04 ENCOUNTER — Other Ambulatory Visit: Payer: Self-pay | Admitting: Obstetrics & Gynecology

## 2015-08-04 DIAGNOSIS — Z3041 Encounter for surveillance of contraceptive pills: Secondary | ICD-10-CM

## 2015-08-04 MED ORDER — NORGESTIMATE-ETH ESTRADIOL 0.25-35 MG-MCG PO TABS: 1.0000 | ORAL_TABLET | Freq: Every day | ORAL | Status: DC

## 2015-08-04 NOTE — Progress Notes (Signed)
Patient did not have copay and was rescheduled for annual exam. Requested refill of pills to last until appt. Approved by Dr. Macon LargeAnyanwu

## 2015-09-01 ENCOUNTER — Encounter: Payer: Self-pay | Admitting: Obstetrics & Gynecology

## 2015-09-01 ENCOUNTER — Ambulatory Visit (INDEPENDENT_AMBULATORY_CARE_PROVIDER_SITE_OTHER): Payer: 59 | Admitting: Obstetrics & Gynecology

## 2015-09-01 VITALS — BP 137/96 | HR 89 | Wt 267.3 lb

## 2015-09-01 DIAGNOSIS — Z01419 Encounter for gynecological examination (general) (routine) without abnormal findings: Secondary | ICD-10-CM | POA: Diagnosis not present

## 2015-09-01 DIAGNOSIS — Z113 Encounter for screening for infections with a predominantly sexual mode of transmission: Secondary | ICD-10-CM

## 2015-09-01 DIAGNOSIS — Z124 Encounter for screening for malignant neoplasm of cervix: Secondary | ICD-10-CM

## 2015-09-01 DIAGNOSIS — Z3041 Encounter for surveillance of contraceptive pills: Secondary | ICD-10-CM | POA: Diagnosis not present

## 2015-09-01 MED ORDER — NORGESTIMATE-ETH ESTRADIOL 0.25-35 MG-MCG PO TABS: 1.0000 | ORAL_TABLET | Freq: Every day | ORAL | 5 refills | Status: DC

## 2015-09-01 NOTE — Patient Instructions (Signed)
Preventive Care for Adults, Female A healthy lifestyle and preventive care can promote health and wellness. Preventive health guidelines for women include the following key practices.  A routine yearly physical is a good way to check with your health care provider about your health and preventive screening. It is a chance to share any concerns and updates on your health and to receive a thorough exam.  Visit your dentist for a routine exam and preventive care every 6 months. Brush your teeth twice a day and floss once a day. Good oral hygiene prevents tooth decay and gum disease.  The frequency of eye exams is based on your age, health, family medical history, use of contact lenses, and other factors. Follow your health care provider's recommendations for frequency of eye exams.  Eat a healthy diet. Foods like vegetables, fruits, whole grains, low-fat dairy products, and lean protein foods contain the nutrients you need without too many calories. Decrease your intake of foods high in solid fats, added sugars, and salt. Eat the right amount of calories for you.Get information about a proper diet from your health care provider, if necessary.  Regular physical exercise is one of the most important things you can do for your health. Most adults should get at least 150 minutes of moderate-intensity exercise (any activity that increases your heart rate and causes you to sweat) each week. In addition, most adults need muscle-strengthening exercises on 2 or more days a week.  Maintain a healthy weight. The body mass index (BMI) is a screening tool to identify possible weight problems. It provides an estimate of body fat based on height and weight. Your health care provider can find your BMI and can help you achieve or maintain a healthy weight.For adults 20 years and older:  A BMI below 18.5 is considered underweight.  A BMI of 18.5 to 24.9 is normal.  A BMI of 25 to 29.9 is considered overweight.  A  BMI of 30 and above is considered obese.  Maintain normal blood lipids and cholesterol levels by exercising and minimizing your intake of saturated fat. Eat a balanced diet with plenty of fruit and vegetables. Blood tests for lipids and cholesterol should begin at age 45 and be repeated every 5 years. If your lipid or cholesterol levels are high, you are over 50, or you are at high risk for heart disease, you may need your cholesterol levels checked more frequently.Ongoing high lipid and cholesterol levels should be treated with medicines if diet and exercise are not working.  If you smoke, find out from your health care provider how to quit. If you do not use tobacco, do not start.  Lung cancer screening is recommended for adults aged 45-80 years who are at high risk for developing lung cancer because of a history of smoking. A yearly low-dose CT scan of the lungs is recommended for people who have at least a 30-pack-year history of smoking and are a current smoker or have quit within the past 15 years. A pack year of smoking is smoking an average of 1 pack of cigarettes a day for 1 year (for example: 1 pack a day for 30 years or 2 packs a day for 15 years). Yearly screening should continue until the smoker has stopped smoking for at least 15 years. Yearly screening should be stopped for people who develop a health problem that would prevent them from having lung cancer treatment.  If you are pregnant, do not drink alcohol. If you are  breastfeeding, be very cautious about drinking alcohol. If you are not pregnant and choose to drink alcohol, do not have more than 1 drink per day. One drink is considered to be 12 ounces (355 mL) of beer, 5 ounces (148 mL) of wine, or 1.5 ounces (44 mL) of liquor.  Avoid use of street drugs. Do not share needles with anyone. Ask for help if you need support or instructions about stopping the use of drugs.  High blood pressure causes heart disease and increases the risk  of stroke. Your blood pressure should be checked at least every 1 to 2 years. Ongoing high blood pressure should be treated with medicines if weight loss and exercise do not work.  If you are 55-79 years old, ask your health care provider if you should take aspirin to prevent strokes.  Diabetes screening is done by taking a blood sample to check your blood glucose level after you have not eaten for a certain period of time (fasting). If you are not overweight and you do not have risk factors for diabetes, you should be screened once every 3 years starting at age 45. If you are overweight or obese and you are 40-70 years of age, you should be screened for diabetes every year as part of your cardiovascular risk assessment.  Breast cancer screening is essential preventive care for women. You should practice "breast self-awareness." This means understanding the normal appearance and feel of your breasts and may include breast self-examination. Any changes detected, no matter how small, should be reported to a health care provider. Women in their 20s and 30s should have a clinical breast exam (CBE) by a health care provider as part of a regular health exam every 1 to 3 years. After age 40, women should have a CBE every year. Starting at age 40, women should consider having a mammogram (breast X-ray test) every year. Women who have a family history of breast cancer should talk to their health care provider about genetic screening. Women at a high risk of breast cancer should talk to their health care providers about having an MRI and a mammogram every year.  Breast cancer gene (BRCA)-related cancer risk assessment is recommended for women who have family members with BRCA-related cancers. BRCA-related cancers include breast, ovarian, tubal, and peritoneal cancers. Having family members with these cancers may be associated with an increased risk for harmful changes (mutations) in the breast cancer genes BRCA1 and  BRCA2. Results of the assessment will determine the need for genetic counseling and BRCA1 and BRCA2 testing.  Your health care provider may recommend that you be screened regularly for cancer of the pelvic organs (ovaries, uterus, and vagina). This screening involves a pelvic examination, including checking for microscopic changes to the surface of your cervix (Pap test). You may be encouraged to have this screening done every 3 years, beginning at age 21.  For women ages 30-65, health care providers may recommend pelvic exams and Pap testing every 3 years, or they may recommend the Pap and pelvic exam, combined with testing for human papilloma virus (HPV), every 5 years. Some types of HPV increase your risk of cervical cancer. Testing for HPV may also be done on women of any age with unclear Pap test results.  Other health care providers may not recommend any screening for nonpregnant women who are considered low risk for pelvic cancer and who do not have symptoms. Ask your health care provider if a screening pelvic exam is right for   you.  If you have had past treatment for cervical cancer or a condition that could lead to cancer, you need Pap tests and screening for cancer for at least 20 years after your treatment. If Pap tests have been discontinued, your risk factors (such as having a new sexual partner) need to be reassessed to determine if screening should resume. Some women have medical problems that increase the chance of getting cervical cancer. In these cases, your health care provider may recommend more frequent screening and Pap tests.  Colorectal cancer can be detected and often prevented. Most routine colorectal cancer screening begins at the age of 50 years and continues through age 75 years. However, your health care provider may recommend screening at an earlier age if you have risk factors for colon cancer. On a yearly basis, your health care provider may provide home test kits to check  for hidden blood in the stool. Use of a small camera at the end of a tube, to directly examine the colon (sigmoidoscopy or colonoscopy), can detect the earliest forms of colorectal cancer. Talk to your health care provider about this at age 50, when routine screening begins. Direct exam of the colon should be repeated every 5-10 years through age 75 years, unless early forms of precancerous polyps or small growths are found.  People who are at an increased risk for hepatitis B should be screened for this virus. You are considered at high risk for hepatitis B if:  You were born in a country where hepatitis B occurs often. Talk with your health care provider about which countries are considered high risk.  Your parents were born in a high-risk country and you have not received a shot to protect against hepatitis B (hepatitis B vaccine).  You have HIV or AIDS.  You use needles to inject street drugs.  You live with, or have sex with, someone who has hepatitis B.  You get hemodialysis treatment.  You take certain medicines for conditions like cancer, organ transplantation, and autoimmune conditions.  Hepatitis C blood testing is recommended for all people born from 1945 through 1965 and any individual with known risks for hepatitis C.  Practice safe sex. Use condoms and avoid high-risk sexual practices to reduce the spread of sexually transmitted infections (STIs). STIs include gonorrhea, chlamydia, syphilis, trichomonas, herpes, HPV, and human immunodeficiency virus (HIV). Herpes, HIV, and HPV are viral illnesses that have no cure. They can result in disability, cancer, and death.  You should be screened for sexually transmitted illnesses (STIs) including gonorrhea and chlamydia if:  You are sexually active and are younger than 24 years.  You are older than 24 years and your health care provider tells you that you are at risk for this type of infection.  Your sexual activity has changed  since you were last screened and you are at an increased risk for chlamydia or gonorrhea. Ask your health care provider if you are at risk.  If you are at risk of being infected with HIV, it is recommended that you take a prescription medicine daily to prevent HIV infection. This is called preexposure prophylaxis (PrEP). You are considered at risk if:  You are sexually active and do not regularly use condoms or know the HIV status of your partner(s).  You take drugs by injection.  You are sexually active with a partner who has HIV.  Talk with your health care provider about whether you are at high risk of being infected with HIV. If   you choose to begin PrEP, you should first be tested for HIV. You should then be tested every 3 months for as long as you are taking PrEP.  Osteoporosis is a disease in which the bones lose minerals and strength with aging. This can result in serious bone fractures or breaks. The risk of osteoporosis can be identified using a bone density scan. Women ages 67 years and over and women at risk for fractures or osteoporosis should discuss screening with their health care providers. Ask your health care provider whether you should take a calcium supplement or vitamin D to reduce the rate of osteoporosis.  Menopause can be associated with physical symptoms and risks. Hormone replacement therapy is available to decrease symptoms and risks. You should talk to your health care provider about whether hormone replacement therapy is right for you.  Use sunscreen. Apply sunscreen liberally and repeatedly throughout the day. You should seek shade when your shadow is shorter than you. Protect yourself by wearing long sleeves, pants, a wide-brimmed hat, and sunglasses year round, whenever you are outdoors.  Once a month, do a whole body skin exam, using a mirror to look at the skin on your back. Tell your health care provider of new moles, moles that have irregular borders, moles that  are larger than a pencil eraser, or moles that have changed in shape or color.  Stay current with required vaccines (immunizations).  Influenza vaccine. All adults should be immunized every year.  Tetanus, diphtheria, and acellular pertussis (Td, Tdap) vaccine. Pregnant women should receive 1 dose of Tdap vaccine during each pregnancy. The dose should be obtained regardless of the length of time since the last dose. Immunization is preferred during the 27th-36th week of gestation. An adult who has not previously received Tdap or who does not know her vaccine status should receive 1 dose of Tdap. This initial dose should be followed by tetanus and diphtheria toxoids (Td) booster doses every 10 years. Adults with an unknown or incomplete history of completing a 3-dose immunization series with Td-containing vaccines should begin or complete a primary immunization series including a Tdap dose. Adults should receive a Td booster every 10 years.  Varicella vaccine. An adult without evidence of immunity to varicella should receive 2 doses or a second dose if she has previously received 1 dose. Pregnant females who do not have evidence of immunity should receive the first dose after pregnancy. This first dose should be obtained before leaving the health care facility. The second dose should be obtained 4-8 weeks after the first dose.  Human papillomavirus (HPV) vaccine. Females aged 13-26 years who have not received the vaccine previously should obtain the 3-dose series. The vaccine is not recommended for use in pregnant females. However, pregnancy testing is not needed before receiving a dose. If a female is found to be pregnant after receiving a dose, no treatment is needed. In that case, the remaining doses should be delayed until after the pregnancy. Immunization is recommended for any person with an immunocompromised condition through the age of 61 years if she did not get any or all doses earlier. During the  3-dose series, the second dose should be obtained 4-8 weeks after the first dose. The third dose should be obtained 24 weeks after the first dose and 16 weeks after the second dose.  Zoster vaccine. One dose is recommended for adults aged 30 years or older unless certain conditions are present.  Measles, mumps, and rubella (MMR) vaccine. Adults born  before 1957 generally are considered immune to measles and mumps. Adults born in 1957 or later should have 1 or more doses of MMR vaccine unless there is a contraindication to the vaccine or there is laboratory evidence of immunity to each of the three diseases. A routine second dose of MMR vaccine should be obtained at least 28 days after the first dose for students attending postsecondary schools, health care workers, or international travelers. People who received inactivated measles vaccine or an unknown type of measles vaccine during 1963-1967 should receive 2 doses of MMR vaccine. People who received inactivated mumps vaccine or an unknown type of mumps vaccine before 1979 and are at high risk for mumps infection should consider immunization with 2 doses of MMR vaccine. For females of childbearing age, rubella immunity should be determined. If there is no evidence of immunity, females who are not pregnant should be vaccinated. If there is no evidence of immunity, females who are pregnant should delay immunization until after pregnancy. Unvaccinated health care workers born before 1957 who lack laboratory evidence of measles, mumps, or rubella immunity or laboratory confirmation of disease should consider measles and mumps immunization with 2 doses of MMR vaccine or rubella immunization with 1 dose of MMR vaccine.  Pneumococcal 13-valent conjugate (PCV13) vaccine. When indicated, a person who is uncertain of his immunization history and has no record of immunization should receive the PCV13 vaccine. All adults 65 years of age and older should receive this  vaccine. An adult aged 19 years or older who has certain medical conditions and has not been previously immunized should receive 1 dose of PCV13 vaccine. This PCV13 should be followed with a dose of pneumococcal polysaccharide (PPSV23) vaccine. Adults who are at high risk for pneumococcal disease should obtain the PPSV23 vaccine at least 8 weeks after the dose of PCV13 vaccine. Adults older than 28 years of age who have normal immune system function should obtain the PPSV23 vaccine dose at least 1 year after the dose of PCV13 vaccine.  Pneumococcal polysaccharide (PPSV23) vaccine. When PCV13 is also indicated, PCV13 should be obtained first. All adults aged 65 years and older should be immunized. An adult younger than age 65 years who has certain medical conditions should be immunized. Any person who resides in a nursing home or long-term care facility should be immunized. An adult smoker should be immunized. People with an immunocompromised condition and certain other conditions should receive both PCV13 and PPSV23 vaccines. People with human immunodeficiency virus (HIV) infection should be immunized as soon as possible after diagnosis. Immunization during chemotherapy or radiation therapy should be avoided. Routine use of PPSV23 vaccine is not recommended for American Indians, [place] Natives, or people younger than 65 years unless there are medical conditions that require PPSV23 vaccine. When indicated, people who have unknown immunization and have no record of immunization should receive PPSV23 vaccine. One-time revaccination 5 years after the first dose of PPSV23 is recommended for people aged 19-64 years who have chronic kidney failure, nephrotic syndrome, asplenia, or immunocompromised conditions. People who received 1-2 doses of PPSV23 before age 65 years should receive another dose of PPSV23 vaccine at age 65 years or later if at least 5 years have passed since the previous dose. Doses of PPSV23 are not  needed for people immunized with PPSV23 at or after age 65 years.  Meningococcal vaccine. Adults with asplenia or persistent complement component deficiencies should receive 2 doses of quadrivalent meningococcal conjugate (MenACWY-D) vaccine. The doses should be obtained   at least 2 months apart. Microbiologists working with certain meningococcal bacteria, [place] recruits, people at risk during an outbreak, and people who travel to or live in countries with a high rate of meningitis should be immunized. A first-year college student up through age 34 years who is living in a residence hall should receive a dose if she did not receive a dose on or after her 16th birthday. Adults who have certain high-risk conditions should receive one or more doses of vaccine.  Hepatitis A vaccine. Adults who wish to be protected from this disease, have certain high-risk conditions, work with hepatitis A-infected animals, work in hepatitis A research labs, or travel to or work in countries with a high rate of hepatitis A should be immunized. Adults who were previously unvaccinated and who anticipate close contact with an international adoptee during the first 60 days after arrival in the [place] from a country with a high rate of hepatitis A should be immunized.  Hepatitis B vaccine. Adults who wish to be protected from this disease, have certain high-risk conditions, may be exposed to blood or other infectious body fluids, are household contacts or sex partners of hepatitis B positive people, are clients or workers in certain care facilities, or travel to or work in countries with a high rate of hepatitis B should be immunized.  Haemophilus influenzae type b (Hib) vaccine. A previously unvaccinated person with asplenia or sickle cell disease or having a scheduled splenectomy should receive 1 dose of Hib vaccine. Regardless of previous immunization, a recipient of a hematopoietic stem cell transplant should receive a  3-dose series 6-12 months after her successful transplant. Hib vaccine is not recommended for adults with HIV infection. Preventive Services / Frequency Ages 35 to 4 years  Blood pressure check.** / Every 3-5 years.  Lipid and cholesterol check.** / Every 5 years beginning at age 60.  Clinical breast exam.** / Every 3 years for women in their 71s and 10s.  BRCA-related cancer risk assessment.** / For women who have family members with a BRCA-related cancer (breast, ovarian, tubal, or peritoneal cancers).  Pap test.** / Every 2 years from ages 76 through 26. Every 3 years starting at age 61 through age 76 or 93 with a history of 3 consecutive normal Pap tests.  HPV screening.** / Every 3 years from ages 37 through ages 60 to 51 with a history of 3 consecutive normal Pap tests.  Hepatitis C blood test.** / For any individual with known risks for hepatitis C.  Skin self-exam. / Monthly.  Influenza vaccine. / Every year.  Tetanus, diphtheria, and acellular pertussis (Tdap, Td) vaccine.** / Consult your health care provider. Pregnant women should receive 1 dose of Tdap vaccine during each pregnancy. 1 dose of Td every 10 years.  Varicella vaccine.** / Consult your health care provider. Pregnant females who do not have evidence of immunity should receive the first dose after pregnancy.  HPV vaccine. / 3 doses over 6 months, if 93 and younger. The vaccine is not recommended for use in pregnant females. However, pregnancy testing is not needed before receiving a dose.  Measles, mumps, rubella (MMR) vaccine.** / You need at least 1 dose of MMR if you were born in 1957 or later. You may also need a 2nd dose. For females of childbearing age, rubella immunity should be determined. If there is no evidence of immunity, females who are not pregnant should be vaccinated. If there is no evidence of immunity, females who are  pregnant should delay immunization until after pregnancy.  Pneumococcal  13-valent conjugate (PCV13) vaccine.** / Consult your health care provider.  Pneumococcal polysaccharide (PPSV23) vaccine.** / 1 to 2 doses if you smoke cigarettes or if you have certain conditions.  Meningococcal vaccine.** / 1 dose if you are age 68 to 8 years and a Market researcher living in a residence hall, or have one of several medical conditions, you need to get vaccinated against meningococcal disease. You may also need additional booster doses.  Hepatitis A vaccine.** / Consult your health care provider.  Hepatitis B vaccine.** / Consult your health care provider.  Haemophilus influenzae type b (Hib) vaccine.** / Consult your health care provider. Ages 7 to 53 years  Blood pressure check.** / Every year.  Lipid and cholesterol check.** / Every 5 years beginning at age 25 years.  Lung cancer screening. / Every year if you are aged 11-80 years and have a 30-pack-year history of smoking and currently smoke or have quit within the past 15 years. Yearly screening is stopped once you have quit smoking for at least 15 years or develop a health problem that would prevent you from having lung cancer treatment.  Clinical breast exam.** / Every year after age 48 years.  BRCA-related cancer risk assessment.** / For women who have family members with a BRCA-related cancer (breast, ovarian, tubal, or peritoneal cancers).  Mammogram.** / Every year beginning at age 41 years and continuing for as long as you are in good health. Consult with your health care provider.  Pap test.** / Every 3 years starting at age 65 years through age 37 or 70 years with a history of 3 consecutive normal Pap tests.  HPV screening.** / Every 3 years from ages 72 years through ages 60 to 40 years with a history of 3 consecutive normal Pap tests.  Fecal occult blood test (FOBT) of stool. / Every year beginning at age 21 years and continuing until age 5 years. You may not need to do this test if you get  a colonoscopy every 10 years.  Flexible sigmoidoscopy or colonoscopy.** / Every 5 years for a flexible sigmoidoscopy or every 10 years for a colonoscopy beginning at age 35 years and continuing until age 48 years.  Hepatitis C blood test.** / For all people born from [birth year] through 1965 and any individual with known risks for hepatitis C.  Skin self-exam. / Monthly.  Influenza vaccine. / Every year.  Tetanus, diphtheria, and acellular pertussis (Tdap/Td) vaccine.** / Consult your health care provider. Pregnant women should receive 1 dose of Tdap vaccine during each pregnancy. 1 dose of Td every 10 years.  Varicella vaccine.** / Consult your health care provider. Pregnant females who do not have evidence of immunity should receive the first dose after pregnancy.  Zoster vaccine.** / 1 dose for adults aged 30 years or older.  Measles, mumps, rubella (MMR) vaccine.** / You need at least 1 dose of MMR if you were born in 1957 or later. You may also need a second dose. For females of childbearing age, rubella immunity should be determined. If there is no evidence of immunity, females who are not pregnant should be vaccinated. If there is no evidence of immunity, females who are pregnant should delay immunization until after pregnancy.  Pneumococcal 13-valent conjugate (PCV13) vaccine.** / Consult your health care provider.  Pneumococcal polysaccharide (PPSV23) vaccine.** / 1 to 2 doses if you smoke cigarettes or if you have certain conditions.  Meningococcal vaccine.** /  Consult your health care provider.  Hepatitis A vaccine.** / Consult your health care provider.  Hepatitis B vaccine.** / Consult your health care provider.  Haemophilus influenzae type b (Hib) vaccine.** / Consult your health care provider. Ages 64 years and over  Blood pressure check.** / Every year.  Lipid and cholesterol check.** / Every 5 years beginning at age 23 years.  Lung cancer screening. / Every year if you  are aged 16-80 years and have a 30-pack-year history of smoking and currently smoke or have quit within the past 15 years. Yearly screening is stopped once you have quit smoking for at least 15 years or develop a health problem that would prevent you from having lung cancer treatment.  Clinical breast exam.** / Every year after age 74 years.  BRCA-related cancer risk assessment.** / For women who have family members with a BRCA-related cancer (breast, ovarian, tubal, or peritoneal cancers).  Mammogram.** / Every year beginning at age 44 years and continuing for as long as you are in good health. Consult with your health care provider.  Pap test.** / Every 3 years starting at age 58 years through age 22 or 39 years with 3 consecutive normal Pap tests. Testing can be stopped between 65 and 70 years with 3 consecutive normal Pap tests and no abnormal Pap or HPV tests in the past 10 years.  HPV screening.** / Every 3 years from ages 64 years through ages 70 or 61 years with a history of 3 consecutive normal Pap tests. Testing can be stopped between 65 and 70 years with 3 consecutive normal Pap tests and no abnormal Pap or HPV tests in the past 10 years.  Fecal occult blood test (FOBT) of stool. / Every year beginning at age 40 years and continuing until age 27 years. You may not need to do this test if you get a colonoscopy every 10 years.  Flexible sigmoidoscopy or colonoscopy.** / Every 5 years for a flexible sigmoidoscopy or every 10 years for a colonoscopy beginning at age 7 years and continuing until age 32 years.  Hepatitis C blood test.** / For all people born from [birth year] through 1963 and any individual with known risks for hepatitis C.  Osteoporosis screening.** / A one-time screening for women ages 30 years and over and women at risk for fractures or osteoporosis.  Skin self-exam. / Monthly.  Influenza vaccine. / Every year.  Tetanus, diphtheria, and acellular pertussis (Tdap/Td)  vaccine.** / 1 dose of Td every 10 years.  Varicella vaccine.** / Consult your health care provider.  Zoster vaccine.** / 1 dose for adults aged 35 years or older.  Pneumococcal 13-valent conjugate (PCV13) vaccine.** / Consult your health care provider.  Pneumococcal polysaccharide (PPSV23) vaccine.** / 1 dose for all adults aged 46 years and older.  Meningococcal vaccine.** / Consult your health care provider.  Hepatitis A vaccine.** / Consult your health care provider.  Hepatitis B vaccine.** / Consult your health care provider.  Haemophilus influenzae type b (Hib) vaccine.** / Consult your health care provider. ** Family history and personal history of risk and conditions may change your health care provider's recommendations.   This information is not intended to replace advice given to you by your health care provider. Make sure you discuss any questions you have with your health care provider.   Document Released: 02/27/2001 Document Revised: 01/22/2014 Document Reviewed: 05/29/2010 Elsevier Interactive Patient Education [employer].

## 2015-09-01 NOTE — Progress Notes (Signed)
GYNECOLOGY CLINIC ANNUAL PREVENTATIVE CARE ENCOUNTER NOTE  Subjective:   Angel Thomas is a 30 y.o. [id number] female here for a routine annual gynecologic exam.  Current complaints: none.  Desires OCP refill and STI testing. Denies abnormal vaginal bleeding, discharge, pelvic pain, problems with intercourse or other gynecologic concerns.    Gynecologic History Patient's last menstrual period was 08/07/2015 (approximate). Contraception: OCP (estrogen/progesterone) Last Pap: 07/14/2014. Results were: normal.  Obstetric History OB History  Gravida Para Term Preterm AB Living  1 0 0 0 1 0  SAB TAB Ectopic Multiple Live Births  0 0 1 0      # Outcome Date GA Lbr Len/2nd Weight Sex Delivery Anes PTL Lv  1 Ectopic              Birth Comments: System Generated. Please review and update pregnancy details.      Past Medical History:  Diagnosis Date  . No pertinent past medical history     Past Surgical History:  Procedure Laterality Date  . LAPAROSCOPY  12/07/2011   Procedure: LAPAROSCOPY OPERATIVE;  Surgeon: Tereso NewcomerUgonna A Vernecia Umble, MD;  Location: [facility];  Service: Gynecology;  Laterality: Right;  Right Salpingectomy, Lysis of Adhesions  . TONSILLECTOMY    . WISDOM TOOTH EXTRACTION      Current Outpatient Prescriptions on File Prior to Visit  Medication Sig Dispense Refill  . albuterol (PROVENTIL HFA;VENTOLIN HFA) 108 (90 BASE) MCG/ACT inhaler Inhale 2 puffs into the lungs every 4 (four) hours as needed for wheezing or shortness of breath. 1 Inhaler 0  . norgestimate-ethinyl estradiol (SPRINTEC 28) 0.25-35 MG-MCG tablet Take 1 tablet by mouth daily. 28 tablet 1  . hydrochlorothiazide (HYDRODIURIL) 25 MG tablet Take 1 tablet (25 mg total) by mouth daily. (Patient not taking: Reported on 09/01/2015) 30 tablet 3  . predniSONE (DELTASONE) 20 MG tablet Take 3 tabs po on first day, 2 tabs second day, 2 tabs third day, 1 tab fourth day, 1 tab 5th day. Take with food. (Patient not taking: Reported  on 09/01/2015) 9 tablet 0   No current facility-administered medications on file prior to visit.     Allergies  Allergen Reactions  . Almond Meal     Throat itches and tongue becomes tingly    Social History   Social History  . Marital status: Single    Spouse name: N/A  . Number of children: N/A  . Years of education: N/A   Occupational History  . Not on file.   Social History Main Topics  . Smoking status: Former Smoker    Quit date: 01/15/2014  . Smokeless tobacco: Never Used  . Alcohol use No  . Drug use: No  . Sexual activity: Yes    Birth control/ protection: None   Other Topics Concern  . Not on file   Social History Narrative  . No narrative on file    Family History  Problem Relation Age of Onset  . Other Neg Hx     The following portions of the patient's history were reviewed and updated as appropriate: allergies, current medications, past family history, past medical history, past social history, past surgical history and problem list.  Review of Systems Pertinent items noted in HPI and remainder of comprehensive ROS otherwise negative.   Objective:  BP (!) 137/96   Pulse 89   Wt 267 lb 4.8 oz (121.2 kg)   LMP 08/07/2015 (Approximate)   BMI 40.64 kg/m  CONSTITUTIONAL: Well-developed, well-nourished female in no  acute distress.  HENT:  Normocephalic, atraumatic, External right and left ear normal. Oropharynx is clear and moist EYES: Conjunctivae and EOM are normal. Pupils are equal, round, and reactive to light. No scleral icterus.  NECK: Normal range of motion, supple, no masses.  Normal thyroid.  SKIN: Skin is warm and dry. No rash noted. Not diaphoretic. No erythema. No pallor. NEUROLOGIC: Alert and oriented to person, place, and time. Normal reflexes, muscle tone coordination. No cranial nerve deficit noted. PSYCHIATRIC: Normal mood and affect. Normal behavior. Normal judgment and thought content. CARDIOVASCULAR: Normal heart rate noted,  regular rhythm RESPIRATORY: Clear to auscultation bilaterally. Effort and breath sounds normal, no problems with respiration noted. BREASTS: Symmetric in size. No masses, skin changes, nipple drainage, or lymphadenopathy. ABDOMEN: Soft, normal bowel sounds, no distention noted.  No tenderness, rebound or guarding.  PELVIC: Normal appearing external genitalia; normal appearing vaginal mucosa and cervix.  No abnormal discharge noted.  Pap smear obtained.  Normal uterine size, no other palpable masses, no uterine or adnexal tenderness. MUSCULOSKELETAL: Normal range of motion. No tenderness.  No cyanosis, clubbing, or edema.  2+ distal pulses.   Assessment:  Annual gynecologic examination with pap smear Contraception management  Plan:  Will follow up results of pap smear and manage accordingly. OCPs refilled. Routine preventative health maintenance measures emphasized. Please refer to After Visit Summary for other counseling recommendations.    Jaynie CollinsUGONNA  Shirley Bolle, MD, FACOG Attending Obstetrician & Gynecologist, [facility]

## 2015-09-02 LAB — HIV ANTIBODY (ROUTINE TESTING W REFLEX): HIV: NONREACTIVE

## 2015-09-02 LAB — HEPATITIS B SURFACE ANTIGEN: Hepatitis B Surface Ag: NEGATIVE

## 2015-09-02 LAB — HEPATITIS C ANTIBODY: HCV AB: NEGATIVE

## 2015-09-02 LAB — RPR

## 2015-09-05 LAB — CYTOLOGY - PAP

## 2016-10-25 ENCOUNTER — Ambulatory Visit (HOSPITAL_COMMUNITY)
Admission: EM | Admit: 2016-10-25 | Discharge: 2016-10-25 | Disposition: A | Discharge status: Home / Self Care | Payer: 59 | Attending: Family Medicine | Admitting: Family Medicine

## 2016-10-25 ENCOUNTER — Encounter (HOSPITAL_COMMUNITY): Payer: Self-pay | Admitting: Emergency Medicine

## 2016-10-25 DIAGNOSIS — R200 Anesthesia of skin: Secondary | ICD-10-CM | POA: Diagnosis not present

## 2016-10-25 HISTORY — DX: Unspecified ectopic pregnancy without intrauterine pregnancy: O00.90

## 2016-10-25 NOTE — ED Triage Notes (Signed)
Woke at 5 am with facial numbness and left arm tingling intermittently.  Numbness in left side of face from mid lips to left ear.

## 2016-10-25 NOTE — Discharge Instructions (Signed)
No alarming signs on exam, you had a normal neurology exam. Numbness/tingling could be due to nerve irritation from compression, viral illness, diabetes. You can take ibuprofen/naproxen as needed to help with the symptoms. For lower leg swelling, try compression stockings and elevation to help with symptoms. Better control of blood pressure, decrease salt intake can also help with your symptoms. Follow up with PCP for further workup needed if symptoms do not improve. Monitor for any worsening of symptoms, confusion/altered mental status, facial drooping, weakness, follow up for reevaluation.

## 2016-10-25 NOTE — ED Provider Notes (Signed)
[facility]    CSN: [id number] Arrival date & time: 10/25/16  1727     History   Chief Complaint No chief complaint on file.   HPI Angel Thomas is a 31 y.o. female.   31 year old female with history of ectopic pregnancy with tubal ligation and fallopian tube removal comes in for 1 day history of left facial numbness and arm numbness. Patient states she woke up this morning with the symptoms. Has not noticed any aggravating or alleviating factor. She describes numbness as "when you get numbing medicine from the dentist". She states she has noticed similar symptoms in the arm/hand in the past that resolves on own, but facial numbness had continued. She does sleep on the left side of her body with her hand tucked under the pillow/head. Denies fever, chills, night sweats. Denies facial drooping, weakness, dizziness. Denies URI symptoms such as cough, congestion, sinus pressure. Denies changes in hearing, tinnitus, ear discharge. Denies shooting pain down her face. She has a history of HTN which she does not take medication for. Denies chest pain, shortness of breath, wheezing, headache, orthopnea. Denies nausea, vomiting. Denies personal history of diabetes, CAD. Family history of diabetes, HTN, CAD.      Past Medical History:  Diagnosis Date  . Ectopic pregnancy   . No pertinent past medical history     There are no active problems to display for this patient.   Past Surgical History:  Procedure Laterality Date  . fallopian tube removed    . LAPAROSCOPY  12/07/2011   Procedure: LAPAROSCOPY OPERATIVE;  Surgeon: Tereso Newcomer, MD;  Location: [facility];  Service: Gynecology;  Laterality: Right;  Right Salpingectomy, Lysis of Adhesions  . TONSILLECTOMY    . TUBAL LIGATION    . WISDOM TOOTH EXTRACTION      OB History    Gravida Para Term Preterm AB Living   1 0 0 0 1 0   SAB TAB Ectopic Multiple Live Births   0 0 1 0         Home Medications    Prior to  Admission medications   Medication Sig Start Date End Date Taking? Authorizing Provider  albuterol (PROVENTIL HFA;VENTOLIN HFA) 108 (90 BASE) MCG/ACT inhaler Inhale 2 puffs into the lungs every 4 (four) hours as needed for wheezing or shortness of breath. 04/21/14   Hayden Rasmussen, NP  hydrochlorothiazide (HYDRODIURIL) 25 MG tablet Take 1 tablet (25 mg total) by mouth daily. Patient not taking: Reported on 09/01/2015 07/14/14   Anyanwu, Jethro Bastos, MD  predniSONE (DELTASONE) 20 MG tablet Take 3 tabs po on first day, 2 tabs second day, 2 tabs third day, 1 tab fourth day, 1 tab 5th day. Take with food. Patient not taking: Reported on 09/01/2015 04/21/14   Hayden Rasmussen, NP    Family History Family History  Problem Relation Age of Onset  . Other Neg Hx     Social History Social History  Substance Use Topics  . Smoking status: Former Smoker    Quit date: 01/15/2014  . Smokeless tobacco: Never Used  . Alcohol use No     Allergies   Almond meal   Review of Systems Review of Systems  Reason unable to perform ROS: See HPI as above.     Physical Exam Triage Vital Signs ED Triage Vitals  Enc Vitals Group     BP 10/25/16 1741 (!) 179/118     Pulse Rate 10/25/16 1741 80  Resp 10/25/16 1741 18     Temp 10/25/16 1741 98.2 F (36.8 C)     Temp src --      SpO2 10/25/16 1741 97 %     Weight --      Height --      Head Circumference --      Peak Flow --      Pain Score 10/25/16 1736 4     Pain Loc --      Pain Edu? --      Excl. in GC? --    No data found.   Updated Vital Signs BP (!) 179/118 (BP Location: Left Arm)   Pulse 80   Temp 98.2 F (36.8 C)   Resp 18   SpO2 97%   Physical Exam  Constitutional: She is oriented to person, place, and time. She appears well-developed and well-nourished. No distress.  HENT:  Head: Normocephalic and atraumatic.  Right Ear: Tympanic membrane, external ear and ear canal normal. Tympanic membrane is not erythematous and not bulging.  Left  Ear: Tympanic membrane, external ear and ear canal normal. Tympanic membrane is not erythematous and not bulging.  Nose: Nose normal. Right sinus exhibits no maxillary sinus tenderness and no frontal sinus tenderness. Left sinus exhibits no maxillary sinus tenderness and no frontal sinus tenderness.  Mouth/Throat: Uvula is midline, oropharynx is clear and moist and mucous membranes are normal.  Eyes: Pupils are equal, round, and reactive to light. Conjunctivae and EOM are normal.  Neck: Normal range of motion. Neck supple.  Cardiovascular: Normal rate, regular rhythm and normal heart sounds.  Exam reveals no gallop and no friction rub.   No murmur heard. Pulmonary/Chest: Effort normal and breath sounds normal. She has no decreased breath sounds. She has no wheezes. She has no rhonchi. She has no rales.  Musculoskeletal:  No tenderness to palpation of shoulder/elbow/wrist. Full ROM. Strength normal and equal bilaterally. Sensation intact. Radial pulses 2+ and equal bilaterally. Cap refill <2s  Lymphadenopathy:    She has no cervical adenopathy.  Neurological: She is alert and oriented to person, place, and time. She has normal strength. She is not disoriented. No cranial nerve deficit or sensory deficit. She displays a negative Romberg sign. Coordination and gait normal. GCS eye subscore is 4. GCS verbal subscore is 5. GCS motor subscore is 6.  Sensation intact but slightly decreased on left face, able to distinguish sharp/dull, hot/cold without problem. No obvious facial drooping noticed.   Finger to nose testing normal.   Skin: Skin is warm and dry.  Psychiatric: She has a normal mood and affect. Her behavior is normal. Judgment normal.     UC Treatments / Results  Labs (all labs ordered are listed, but only abnormal results are displayed) Labs Reviewed - No data to display  EKG  EKG Interpretation None       Radiology No results found.  Procedures Procedures (including critical  care time)  Medications Ordered in UC Medications - No data to display   Initial Impression / Assessment and Plan / UC Course  I have reviewed the triage vital signs and the nursing notes.  Pertinent labs & imaging results that were available during my care of the patient were reviewed by me and considered in my medical decision making (see chart for details).    No alarming signs on exam, patient with normal neurology exam. Discussed with patient possible causes of symptoms such as compression from sleeping, viral illness, diabetic neuropathy, bells  palsy. Given normal exam, will have patient monitor for now. Patient to establish care with PCP for further workup if symptoms do not improve. Resources given. Return precautions given.   Final Clinical Impressions(s) / UC Diagnoses   Final diagnoses:  Left facial numbness    New Prescriptions Current Discharge Medication List        Lurline Idol 10/25/16 1610

## 2017-09-10 ENCOUNTER — Encounter: Payer: Self-pay | Admitting: Family Medicine

## 2017-09-10 ENCOUNTER — Ambulatory Visit (INDEPENDENT_AMBULATORY_CARE_PROVIDER_SITE_OTHER): Payer: Self-pay | Admitting: General Practice

## 2017-09-10 DIAGNOSIS — Z3201 Encounter for pregnancy test, result positive: Secondary | ICD-10-CM

## 2017-09-10 LAB — POCT PREGNANCY, URINE: Preg Test, Ur: POSITIVE — AB

## 2017-09-10 NOTE — Progress Notes (Signed)
Patient presents to office today for UPT. UPT +. Patient reports first positive home test 8/18. LMP 07/25/17 EDD 05/01/18 2010w5d. Patient denies taking any meds or vitamins. Recommended she begin PNV. Patient reports a day of spotting with light cramping one day last week and spotting for a couple hours yesterday. Patient reports this is her 2nd pregnancy, her first being an ectopic pregnancy. Will schedule ultrasound this week per Angel Thomas. Scheduled for 8/29 @ 11am per Angel Thomas. Patient informed. Discussed she can also schedule new OB appt. Patient requests to see Angel Thomas for initial. Patient had no questions.

## 2017-09-11 NOTE — Progress Notes (Signed)
Per RN note patient denied any concerning symptoms today, however given history of ectopic pregnancy requiring surgical management will schedule US this week to ensure IUP is identified.  I have reviewed the chart and agree with nursing staff's documentation of this patient's encounter.  Vonzella NippleJulie Prudie Guthridge, PA-C 09/11/2017 8:10 AM

## 2017-09-12 ENCOUNTER — Ambulatory Visit (INDEPENDENT_AMBULATORY_CARE_PROVIDER_SITE_OTHER): Payer: Self-pay | Admitting: General Practice

## 2017-09-12 ENCOUNTER — Ambulatory Visit (HOSPITAL_COMMUNITY)
Admission: RE | Admit: 2017-09-12 | Discharge: 2017-09-12 | Disposition: A | Discharge status: Home / Self Care | Payer: Self-pay | Source: Ambulatory Visit | Attending: Medical | Admitting: Medical

## 2017-09-12 DIAGNOSIS — Z3201 Encounter for pregnancy test, result positive: Secondary | ICD-10-CM | POA: Insufficient documentation

## 2017-09-12 DIAGNOSIS — O283 Abnormal ultrasonic finding on antenatal screening of mother: Secondary | ICD-10-CM

## 2017-09-12 DIAGNOSIS — O3680X0 Pregnancy with inconclusive fetal viability, not applicable or unspecified: Secondary | ICD-10-CM

## 2017-09-12 NOTE — Progress Notes (Signed)
I have reviewed the chart and agree with nursing staff's documentation of this patient's encounter.  Vonzella NippleJulie Helyne Genther, PA-C 09/12/2017 3:05 PM

## 2017-09-12 NOTE — Progress Notes (Signed)
Patient presents to office today for viability/dating ultrasound results. Reviewed results with Vonzella NippleJulie Wenzel who finds ultrasound showing gestational sac but no yolk sac or fetal pole yet-  recommends bhcg today & repeat Saturday 8/31.  Informed patient of results & reviewed plan of care. Patient verbalized understanding and had no questions.

## 2017-09-13 LAB — BETA HCG QUANT (REF LAB): hCG Quant: 30152 m[IU]/mL

## 2017-09-14 ENCOUNTER — Inpatient Hospital Stay (HOSPITAL_COMMUNITY)
Admission: AD | Admit: 2017-09-14 | Discharge: 2017-09-14 | Discharge status: Against Medical Advice | Payer: Self-pay | Source: Ambulatory Visit | Attending: Obstetrics and Gynecology | Admitting: Obstetrics and Gynecology

## 2017-09-14 ENCOUNTER — Encounter: Payer: Self-pay | Admitting: Student

## 2017-09-14 DIAGNOSIS — Z5321 Procedure and treatment not carried out due to patient leaving prior to being seen by health care provider: Secondary | ICD-10-CM | POA: Insufficient documentation

## 2017-09-14 DIAGNOSIS — Z3491 Encounter for supervision of normal pregnancy, unspecified, first trimester: Secondary | ICD-10-CM | POA: Insufficient documentation

## 2017-09-14 DIAGNOSIS — Z3A01 Less than 8 weeks gestation of pregnancy: Secondary | ICD-10-CM | POA: Insufficient documentation

## 2017-09-14 LAB — CBC
HCT: 37.4 % (ref 36.0–46.0)
Hemoglobin: 12.8 g/dL (ref 12.0–15.0)
MCH: 30.1 pg (ref 26.0–34.0)
MCHC: 34.2 g/dL (ref 30.0–36.0)
MCV: 88 fL (ref 78.0–100.0)
PLATELETS: 272 10*3/uL (ref 150–400)
RBC: 4.25 MIL/uL (ref 3.87–5.11)
RDW: 13.3 % (ref 11.5–15.5)
WBC: 10.1 10*3/uL (ref 4.0–10.5)

## 2017-09-14 LAB — HCG, QUANTITATIVE, PREGNANCY: hCG, Beta Chain, Quant, S: 50908 m[IU]/mL — ABNORMAL HIGH (ref <5)

## 2017-09-14 NOTE — MAU Note (Addendum)
Angel Thomas is a 32 y.o. at 10373w2d here in MAU for follow up. Pain score: denies Vaginal bleeding: denies Patient verbalized she was not aware she needed to stay for results and had not made arrangements to do so. Rn informed patient that patients wait the hour for results and then will discuss them with patient. Erin NP made aware. Patients best contact number is [phone]. Vitals:   09/14/17 1211  BP: 139/88  Pulse: 94  Resp: 19  Temp: 98.2 F (36.8 C)  SpO2: 99%

## 2017-09-14 NOTE — MAU Note (Addendum)
Patient told registration that she was leaving. AMA documented. Denny PeonErin NP made aware

## 2017-09-20 ENCOUNTER — Encounter (HOSPITAL_COMMUNITY)
Admission: AD | Disposition: A | Discharge status: Home / Self Care | Payer: Self-pay | Source: Home / Self Care | Attending: Obstetrics and Gynecology

## 2017-09-20 ENCOUNTER — Ambulatory Visit (HOSPITAL_COMMUNITY): Admission: EM | Admit: 2017-09-20 | Discharge: 2017-09-20 | Discharge status: Absent without leave | Payer: Self-pay

## 2017-09-20 ENCOUNTER — Inpatient Hospital Stay (HOSPITAL_COMMUNITY)
Admission: AD | Admit: 2017-09-20 | Discharge: 2017-09-20 | DRG: 817 | Disposition: A | Discharge status: Home / Self Care | Payer: Self-pay | Attending: Obstetrics and Gynecology | Admitting: Obstetrics and Gynecology

## 2017-09-20 ENCOUNTER — Inpatient Hospital Stay (HOSPITAL_COMMUNITY): Payer: Self-pay | Admitting: Anesthesiology

## 2017-09-20 ENCOUNTER — Encounter (HOSPITAL_COMMUNITY): Payer: Self-pay | Admitting: *Deleted

## 2017-09-20 ENCOUNTER — Telehealth: Payer: Self-pay | Admitting: *Deleted

## 2017-09-20 ENCOUNTER — Inpatient Hospital Stay (HOSPITAL_COMMUNITY): Payer: Self-pay

## 2017-09-20 DIAGNOSIS — K661 Hemoperitoneum: Secondary | ICD-10-CM | POA: Diagnosis present

## 2017-09-20 DIAGNOSIS — O00112 Left tubal pregnancy with intrauterine pregnancy: Secondary | ICD-10-CM

## 2017-09-20 DIAGNOSIS — Z3A08 8 weeks gestation of pregnancy: Secondary | ICD-10-CM

## 2017-09-20 DIAGNOSIS — O3680X0 Pregnancy with inconclusive fetal viability, not applicable or unspecified: Secondary | ICD-10-CM

## 2017-09-20 DIAGNOSIS — O009 Unspecified ectopic pregnancy without intrauterine pregnancy: Secondary | ICD-10-CM

## 2017-09-20 DIAGNOSIS — Z87891 Personal history of nicotine dependence: Secondary | ICD-10-CM

## 2017-09-20 DIAGNOSIS — O00102 Left tubal pregnancy without intrauterine pregnancy: Secondary | ICD-10-CM | POA: Diagnosis present

## 2017-09-20 HISTORY — DX: Other seasonal allergic rhinitis: J30.2

## 2017-09-20 HISTORY — PX: LAPAROSCOPY: SHX197

## 2017-09-20 HISTORY — DX: Unspecified ectopic pregnancy without intrauterine pregnancy: O00.90

## 2017-09-20 HISTORY — PX: LAPAROSCOPIC UNILATERAL SALPINGECTOMY: SHX5934

## 2017-09-20 LAB — CBC
HCT: 35.3 % — ABNORMAL LOW (ref 36.0–46.0)
HEMOGLOBIN: 11.8 g/dL — AB (ref 12.0–15.0)
MCH: 29.3 pg (ref 26.0–34.0)
MCHC: 33.4 g/dL (ref 30.0–36.0)
MCV: 87.6 fL (ref 78.0–100.0)
Platelets: 325 10*3/uL (ref 150–400)
RBC: 4.03 MIL/uL (ref 3.87–5.11)
RDW: 13.5 % (ref 11.5–15.5)
WBC: 16.2 10*3/uL — AB (ref 4.0–10.5)

## 2017-09-20 LAB — URINALYSIS, ROUTINE W REFLEX MICROSCOPIC
Bilirubin Urine: NEGATIVE
GLUCOSE, UA: NEGATIVE mg/dL
Ketones, ur: NEGATIVE mg/dL
NITRITE: NEGATIVE
PROTEIN: 100 mg/dL — AB
SPECIFIC GRAVITY, URINE: 1.008 (ref 1.005–1.030)
pH: 6 (ref 5.0–8.0)

## 2017-09-20 LAB — TYPE AND SCREEN
ABO/RH(D): A POS
ANTIBODY SCREEN: NEGATIVE

## 2017-09-20 LAB — HCG, QUANTITATIVE, PREGNANCY: hCG, Beta Chain, Quant, S: 101308 m[IU]/mL — ABNORMAL HIGH (ref <5)

## 2017-09-20 SURGERY — SALPINGECTOMY, UNILATERAL, LAPAROSCOPIC
Anesthesia: General | Site: Abdomen

## 2017-09-20 MED ORDER — HYDROMORPHONE HCL 1 MG/ML IJ SOLN
0.2500 mg | INTRAMUSCULAR | Status: DC | PRN
  Administered 2017-09-20 (×2): 0.5 mg via INTRAVENOUS

## 2017-09-20 MED ORDER — OXYCODONE HCL 5 MG/5ML PO SOLN: 5.0000 mg | Freq: Once | ORAL | Status: AC | PRN

## 2017-09-20 MED ORDER — GLYCOPYRROLATE 0.2 MG/ML IJ SOLN
INTRAMUSCULAR | Status: AC
  Filled 2017-09-20: qty 4

## 2017-09-20 MED ORDER — ONDANSETRON HCL 4 MG/2ML IJ SOLN
INTRAMUSCULAR | Status: AC
  Filled 2017-09-20: qty 2

## 2017-09-20 MED ORDER — 0.9 % SODIUM CHLORIDE (POUR BTL) OPTIME
TOPICAL | Status: DC | PRN
  Administered 2017-09-20: 1000 mL

## 2017-09-20 MED ORDER — FENTANYL CITRATE (PF) 250 MCG/5ML IJ SOLN
INTRAMUSCULAR | Status: AC
  Filled 2017-09-20: qty 5

## 2017-09-20 MED ORDER — OXYCODONE HCL 5 MG PO TABS
5.0000 mg | ORAL_TABLET | Freq: Once | ORAL | Status: AC | PRN
  Administered 2017-09-20: 5 mg via ORAL

## 2017-09-20 MED ORDER — PROMETHAZINE HCL 25 MG/ML IJ SOLN: 6.2500 mg | INTRAMUSCULAR | Status: DC | PRN

## 2017-09-20 MED ORDER — ONDANSETRON HCL 4 MG/2ML IJ SOLN
INTRAMUSCULAR | Status: DC | PRN
  Administered 2017-09-20: 4 mg via INTRAVENOUS

## 2017-09-20 MED ORDER — VITAFOL ULTRA 29-0.6-0.4-200 MG PO CAPS: 1.0000 | ORAL_CAPSULE | Freq: Every day | ORAL | 12 refills | Status: DC

## 2017-09-20 MED ORDER — PROPOFOL 10 MG/ML IV BOLUS
INTRAVENOUS | Status: AC
  Filled 2017-09-20: qty 20

## 2017-09-20 MED ORDER — ROCURONIUM BROMIDE 100 MG/10ML IV SOLN
INTRAVENOUS | Status: DC | PRN
  Administered 2017-09-20: 30 mg via INTRAVENOUS

## 2017-09-20 MED ORDER — ROCURONIUM BROMIDE 100 MG/10ML IV SOLN: INTRAVENOUS | Status: DC | PRN

## 2017-09-20 MED ORDER — DEXAMETHASONE SODIUM PHOSPHATE 10 MG/ML IJ SOLN
INTRAMUSCULAR | Status: DC | PRN
  Administered 2017-09-20: 10 mg via INTRAVENOUS

## 2017-09-20 MED ORDER — SUCCINYLCHOLINE CHLORIDE 200 MG/10ML IV SOSY
PREFILLED_SYRINGE | INTRAVENOUS | Status: DC | PRN
  Administered 2017-09-20: 120 mg via INTRAVENOUS

## 2017-09-20 MED ORDER — SUCCINYLCHOLINE CHLORIDE 200 MG/10ML IV SOSY
PREFILLED_SYRINGE | INTRAVENOUS | Status: AC
  Filled 2017-09-20: qty 10

## 2017-09-20 MED ORDER — FENTANYL CITRATE (PF) 250 MCG/5ML IJ SOLN
INTRAMUSCULAR | Status: DC | PRN
  Administered 2017-09-20: 100 ug via INTRAVENOUS
  Administered 2017-09-20: 150 ug via INTRAVENOUS

## 2017-09-20 MED ORDER — DEXAMETHASONE SODIUM PHOSPHATE 10 MG/ML IJ SOLN
INTRAMUSCULAR | Status: AC
  Filled 2017-09-20: qty 1

## 2017-09-20 MED ORDER — LACTATED RINGERS IV BOLUS
1000.0000 mL | Freq: Once | INTRAVENOUS | Status: AC
  Administered 2017-09-20: 1000 mL via INTRAVENOUS

## 2017-09-20 MED ORDER — LIDOCAINE HCL (CARDIAC) PF 100 MG/5ML IV SOSY
PREFILLED_SYRINGE | INTRAVENOUS | Status: DC | PRN
  Administered 2017-09-20: 80 mg via INTRAVENOUS

## 2017-09-20 MED ORDER — SOD CITRATE-CITRIC ACID 500-334 MG/5ML PO SOLN
30.0000 mL | Freq: Once | ORAL | Status: AC
  Administered 2017-09-20: 30 mL via ORAL
  Filled 2017-09-20: qty 15

## 2017-09-20 MED ORDER — SUGAMMADEX SODIUM 200 MG/2ML IV SOLN
INTRAVENOUS | Status: AC
  Filled 2017-09-20: qty 2

## 2017-09-20 MED ORDER — LABETALOL HCL 200 MG PO TABS: 100.0000 mg | ORAL_TABLET | Freq: Two times a day (BID) | ORAL | 3 refills | Status: DC

## 2017-09-20 MED ORDER — HYDROMORPHONE HCL 1 MG/ML IJ SOLN
INTRAMUSCULAR | Status: AC
  Filled 2017-09-20: qty 0.5

## 2017-09-20 MED ORDER — MIDAZOLAM HCL 2 MG/2ML IJ SOLN
INTRAMUSCULAR | Status: AC
  Filled 2017-09-20: qty 2

## 2017-09-20 MED ORDER — OXYCODONE HCL 5 MG PO TABS
ORAL_TABLET | ORAL | Status: AC
  Filled 2017-09-20: qty 1

## 2017-09-20 MED ORDER — LIDOCAINE HCL (CARDIAC) PF 100 MG/5ML IV SOSY
PREFILLED_SYRINGE | INTRAVENOUS | Status: AC
  Filled 2017-09-20: qty 5

## 2017-09-20 MED ORDER — PROPOFOL 10 MG/ML IV BOLUS
INTRAVENOUS | Status: DC | PRN
  Administered 2017-09-20: 250 mg via INTRAVENOUS

## 2017-09-20 MED ORDER — MEPERIDINE HCL 25 MG/ML IJ SOLN: 6.2500 mg | INTRAMUSCULAR | Status: DC | PRN

## 2017-09-20 MED ORDER — BUPIVACAINE HCL (PF) 0.25 % IJ SOLN
INTRAMUSCULAR | Status: DC | PRN
  Administered 2017-09-20: 20 mL

## 2017-09-20 MED ORDER — FAMOTIDINE IN NACL 20-0.9 MG/50ML-% IV SOLN
20.0000 mg | Freq: Once | INTRAVENOUS | Status: AC
  Administered 2017-09-20: 20 mg via INTRAVENOUS
  Filled 2017-09-20: qty 50

## 2017-09-20 MED ORDER — SUGAMMADEX SODIUM 200 MG/2ML IV SOLN
INTRAVENOUS | Status: DC | PRN
  Administered 2017-09-20: 200 mg via INTRAVENOUS

## 2017-09-20 MED ORDER — LACTATED RINGERS IV SOLN
INTRAVENOUS | Status: DC
  Administered 2017-09-20: 18:00:00 via INTRAVENOUS

## 2017-09-20 MED ORDER — LABETALOL HCL 200 MG PO TABS: 200.0000 mg | ORAL_TABLET | Freq: Two times a day (BID) | ORAL | 3 refills | Status: DC

## 2017-09-20 MED ORDER — SODIUM CHLORIDE 0.9 % IR SOLN
Status: DC | PRN
  Administered 2017-09-20: 3000 mL

## 2017-09-20 MED ORDER — NEOSTIGMINE METHYLSULFATE 10 MG/10ML IV SOLN
INTRAVENOUS | Status: AC
  Filled 2017-09-20: qty 1

## 2017-09-20 MED ORDER — OXYCODONE-ACETAMINOPHEN 5-325 MG PO TABS: 1.0000 | ORAL_TABLET | Freq: Four times a day (QID) | ORAL | 0 refills | Status: DC | PRN

## 2017-09-20 SURGICAL SUPPLY — 31 items
ADH SKN CLS APL DERMABOND .7 (GAUZE/BANDAGES/DRESSINGS) ×1
APL SRG 38 LTWT LNG FL B (MISCELLANEOUS) ×1
APPLICATOR ARISTA FLEXITIP XL (MISCELLANEOUS) ×4 IMPLANT
DERMABOND ADVANCED (GAUZE/BANDAGES/DRESSINGS) ×2
DERMABOND ADVANCED .7 DNX12 (GAUZE/BANDAGES/DRESSINGS) ×2 IMPLANT
DRSG OPSITE POSTOP 3X4 (GAUZE/BANDAGES/DRESSINGS) ×4 IMPLANT
DURAPREP 26ML APPLICATOR (WOUND CARE) ×4 IMPLANT
GLOVE BIOGEL PI IND STRL 6.5 (GLOVE) ×4 IMPLANT
GLOVE BIOGEL PI IND STRL 7.0 (GLOVE) ×4 IMPLANT
GLOVE BIOGEL PI INDICATOR 6.5 (GLOVE) ×4
GLOVE BIOGEL PI INDICATOR 7.0 (GLOVE) ×4
GLOVE SURG SS PI 6.0 STRL IVOR (GLOVE) ×4 IMPLANT
GOWN STRL REUS W/TWL LRG LVL3 (GOWN DISPOSABLE) ×8 IMPLANT
HEMOSTAT ARISTA ABSORB 3G PWDR (MISCELLANEOUS) ×4 IMPLANT
HOVERMATT SINGLE USE (MISCELLANEOUS) ×4 IMPLANT
NS IRRIG 1000ML POUR BTL (IV SOLUTION) ×4 IMPLANT
PACK LAPAROSCOPY BASIN (CUSTOM PROCEDURE TRAY) ×4 IMPLANT
PACK TRENDGUARD 600 HYBRD PROC (MISCELLANEOUS) ×2 IMPLANT
POUCH SPECIMEN RETRIEVAL 10MM (ENDOMECHANICALS) ×4 IMPLANT
PROTECTOR NERVE ULNAR (MISCELLANEOUS) ×8 IMPLANT
SET IRRIG TUBING LAPAROSCOPIC (IRRIGATION / IRRIGATOR) IMPLANT
SHEARS HARMONIC ACE PLUS 36CM (ENDOMECHANICALS) ×4 IMPLANT
SLEEVE XCEL OPT CAN 5 100 (ENDOMECHANICALS) ×4 IMPLANT
SUT MNCRL AB 4-0 PS2 18 (SUTURE) ×4 IMPLANT
SUT VICRYL 0 UR6 27IN ABS (SUTURE) ×8 IMPLANT
TOWEL OR 17X24 6PK STRL BLUE (TOWEL DISPOSABLE) ×8 IMPLANT
TRAY FOLEY W/BAG SLVR 14FR (SET/KITS/TRAYS/PACK) ×4 IMPLANT
TRENDGUARD 600 HYBRID PROC PK (MISCELLANEOUS) ×4
TROCAR BALLN 12MMX100 BLUNT (TROCAR) ×4 IMPLANT
TROCAR XCEL NON-BLD 5MMX100MML (ENDOMECHANICALS) ×4 IMPLANT
TUBING INSUF HEATED (TUBING) ×4 IMPLANT

## 2017-09-20 NOTE — Anesthesia Postprocedure Evaluation (Signed)
Anesthesia Post Note  Patient: Angel Thomas  Procedure(s) Performed: LAPAROSCOPIC LEFT SALPINGECTOMY WITH REMOVAL OF ECTOPIC PREGNANCY (N/A Abdomen) LAPAROSCOPY DIAGNOSTIC     Patient location during evaluation: PACU Anesthesia Type: General Level of consciousness: awake Pain management: pain level controlled Vital Signs Assessment: post-procedure vital signs reviewed and stable Respiratory status: spontaneous breathing Cardiovascular status: stable Postop Assessment: no apparent nausea or vomiting Anesthetic complications: no    Last Vitals:  Vitals:   09/20/17 1945 09/20/17 2000  BP: (!) 155/99 (!) 146/100  Pulse: 98 (!) 102  Resp: 17 19  Temp:    SpO2: 98% 99%    Last Pain:  Vitals:   09/20/17 2000  TempSrc:   PainSc: 5    Pain Goal:                 Jolyne Laye JR,JOHN Ermagene Saidi

## 2017-09-20 NOTE — Discharge Instructions (Signed)
Salpingectomy Salpingectomy, also called tubectomy, is the surgical removal of one of the fallopian tubes. The fallopian tubes are where eggs travel from the ovaries to the uterus. Removing one fallopian tube does not prevent you from becoming pregnant. It also does not cause problems with your menstrual periods. You may need a salpingectomy if you:  Have a fertilized egg that attaches to the fallopian tube (ectopic pregnancy), especially one that causes the tube to burst or tear (rupture).  Have an infected fallopian tube.  Have cancer of the fallopian tube or nearby organs.  Have had an ovary removed due to a cyst or tumor.  Have had your uterus removed.  There are three different methods that can be used for a salpingectomy:  Open. This method involves making one large incision in your abdomen.  Laparoscopic. This method involves using a thin, lighted tube with a tiny camera on the end (laparoscope) to help perform the procedure. The laparoscope will allow your surgeon to make several small incisions in the abdomen instead of a large incision.  Robot-assisted: This method involves using a computer to control surgical instruments that are attached to robotic arms.  Tell a health care provider about:  Any allergies you have.  All medicines you are taking, including vitamins, herbs, eye drops, creams, and over-the-counter medicines.  Any problems you or family members have had with anesthetic medicines.  Any blood disorders you have.  Any surgeries you have had.  Any medical conditions you have.  Whether you are pregnant or may be pregnant. What are the risks? Generally, this is a safe procedure. However, problems may occur, including:  Infection.  Bleeding.  Allergic reactions to medicines.  Damage to other structures or organs.  Blood clots in the legs or lungs.  What happens before the procedure? Staying hydrated Follow instructions from your health care provider  about hydration, which may include:  Up to 2 hours before the procedure - you may continue to drink clear liquids, such as water, clear fruit juice, black coffee, and plain tea.  Eating and drinking restrictions Follow instructions from your health care provider about eating and drinking, which may include:  8 hours before the procedure - stop eating heavy meals or foods such as meat, fried foods, or fatty foods.  6 hours before the procedure - stop eating light meals or foods, such as toast or cereal.  6 hours before the procedure - stop drinking milk or drinks that contain milk.  2 hours before the procedure - stop drinking clear liquids.  Medicines  Ask your health care provider about: ? Changing or stopping your regular medicines. This is especially important if you are taking diabetes medicines or blood thinners. ? Taking medicines such as aspirin and ibuprofen. These medicines can thin your blood. Do not take these medicines before your procedure if your health care provider instructs you not to.  You may be given antibiotic medicine to help prevent infection. General instructions  Do not smoke for at least 2 weeks before your procedure. If you need help quitting, ask your health care provider.  You may have an exam or tests, such as an electrocardiogram (ECG).  You may have a blood or urine sample taken.  Ask your health care provider: ? Whether you should stop removing hair from your surgical area. ? How your surgical site will be marked or identified.  You may be asked to shower with a germ-killing soap.  Plan to have someone take you home   from the hospital or clinic.  If you will be going home right after the procedure, plan to have someone with you for 24 hours. What happens during the procedure?  To reduce your risk of infection: ? Your health care team will wash or sanitize their hands. ? Hair may be removed from the surgical area. ? Your skin will be washed  with soap.  An IV tube will be inserted into one of your veins.  You will be given a medicine to make you fall asleep (general anesthetic). You may also be given a medicine to help you relax (sedative).  A thin tube (catheter) may be inserted through your urethra and into your bladder to drain urine during your procedure.  Depending on the type of procedure you are having, one incision or several small incisions will be made in your abdomen.  Your fallopian tube will be cut and removed from where it attaches to your uterus.  Your blood vessels will be clamped and tied to prevent excess bleeding.  The incision(s) in your abdomen will be closed with stitches (sutures), staples, or skin glue.  A bandage (dressing) may be placed over your incision(s). The procedure may vary among health care providers and hospitals. What happens after the procedure?  Your blood pressure, heart rate, breathing rate, and blood oxygen level will be monitored until the medicines you were given have worn off.  You may continue to receive fluids and medicines through an IV tube.  You may continue to have a catheter draining your urine.  You may have to wear compression stockings. These stockings help to prevent blood clots and reduce swelling in your legs.  You will be given pain medicine as needed.  Do not drive for 24 hours if you received a sedative. Summary  Salpingectomy is a surgical procedure to remove one of the fallopian tubes.  The procedure may be done with an open incision, with a laparoscope, or with computer-controlled instruments.  Depending on the type of procedure you are having, one incision or several small incisions will be made in your abdomen.  Your blood pressure, heart rate, breathing rate, and blood oxygen level will be monitored until the medicines you were given have worn off.  Plan to have someone take you home from the hospital or clinic. This information is not intended  to replace advice given to you by your health care provider. Make sure you discuss any questions you have with your health care provider. Document Released: 05/20/2008 Document Revised: 08/19/2015 Document Reviewed: 06/25/2012 Elsevier Interactive Patient Education  2018 [employer]  

## 2017-09-20 NOTE — H&P (Signed)
History     CSN: [id number]  Arrival date and time: 09/20/17 1423   First Provider Initiated Contact with Patient 09/20/17 1654      Chief Complaint  Patient presents with  . Abdominal Pain  . Emesis   HPI Angel Thomas is a 32 y.o. G2P0010 at [redacted]w[redacted]d who presents with abdominal pain. She states the pain started last night and has gradually gotten worse. The pain is cramping in her lower abdomen and aching in her upper abdomen. She rates the pain a 7/10 and has not tried anything for the pain. She reports some spotting when she wipes.   She was seen on 8/27 with a positive UPT and on 8/29 had an ultrasound that showed a gestational sac but no yolk sac. She came for a repeat HCG on 8/31 with an inappropriate rise in HCG from 30,152 to 50,908. She was scheduled from a repeat u/s in 1 week.   OB History    Gravida  2   Para  0   Term  0   Preterm  0   AB  1   Living  0     SAB  0   TAB  0   Ectopic  1   Multiple  0   Live Births              Past Medical History:  Diagnosis Date  . Ectopic pregnancy   . No pertinent past medical history     Past Surgical History:  Procedure Laterality Date  . fallopian tube removed    . LAPAROSCOPY  12/07/2011   Procedure: LAPAROSCOPY OPERATIVE;  Surgeon: Tereso Newcomer, MD;  Location: [facility];  Service: Gynecology;  Laterality: Right;  Right Salpingectomy, Lysis of Adhesions  . TONSILLECTOMY    . TUBAL LIGATION    . WISDOM TOOTH EXTRACTION      Family History  Problem Relation Age of Onset  . Other Neg Hx     Social History   Tobacco Use  . Smoking status: Former Smoker    Last attempt to quit: 01/15/2014    Years since quitting: 3.6  . Smokeless tobacco: Never Used  Substance Use Topics  . Alcohol use: No    Alcohol/week: 0.0 standard drinks  . Drug use: No    Allergies:  Allergies  Allergen Reactions  . Almond Meal     Throat itches and tongue becomes tingly    Medications Prior to Admission   Medication Sig Dispense Refill Last Dose  . albuterol (PROVENTIL HFA;VENTOLIN HFA) 108 (90 BASE) MCG/ACT inhaler Inhale 2 puffs into the lungs every 4 (four) hours as needed for wheezing or shortness of breath. 1 Inhaler 0 Taking  . hydrochlorothiazide (HYDRODIURIL) 25 MG tablet Take 1 tablet (25 mg total) by mouth daily. (Patient not taking: Reported on 09/01/2015) 30 tablet 3 Not Taking  . predniSONE (DELTASONE) 20 MG tablet Take 3 tabs po on first day, 2 tabs second day, 2 tabs third day, 1 tab fourth day, 1 tab 5th day. Take with food. (Patient not taking: Reported on 09/01/2015) 9 tablet 0 Not Taking    Review of Systems  Constitutional: Negative.  Negative for fatigue and fever.  HENT: Negative.   Respiratory: Negative.  Negative for shortness of breath.   Cardiovascular: Negative.  Negative for chest pain.  Gastrointestinal: Positive for abdominal pain. Negative for constipation, diarrhea, nausea and vomiting.  Genitourinary: Positive for vaginal bleeding. Negative for dysuria and vaginal discharge.  Neurological: Negative.  Negative for dizziness and headaches.   Physical Exam   Blood pressure 125/83, pulse (!) 126, temperature 98 F (36.7 C), temperature source Oral, resp. rate 18, height 5\' 8"  (1.727 m), weight 131.5 kg, last menstrual period 07/25/2017.  Physical Exam  Nursing note and vitals reviewed. Constitutional: She is oriented to person, place, and time. She appears well-developed and well-nourished. No distress.  HENT:  Head: Normocephalic.  Eyes: Pupils are equal, round, and reactive to light.  Cardiovascular: Normal rate, regular rhythm and normal heart sounds.  Respiratory: Effort normal and breath sounds normal. No respiratory distress.  GI: Soft. Bowel sounds are normal. She exhibits no distension. There is tenderness. There is guarding.  Neurological: She is alert and oriented to person, place, and time.  Skin: Skin is warm and dry.  Psychiatric: She has a  normal mood and affect. Her behavior is normal. Judgment and thought content normal.    MAU Course  Procedures Results for orders placed or performed during the hospital encounter of 09/20/17 (from the past 24 hour(s))  Urinalysis, Routine w reflex microscopic     Status: Abnormal   Collection Time: 09/20/17  3:18 PM  Result Value Ref Range   Color, Urine YELLOW YELLOW   APPearance HAZY (A) CLEAR   Specific Gravity, Urine 1.008 1.005 - 1.030   pH 6.0 5.0 - 8.0   Glucose, UA NEGATIVE NEGATIVE mg/dL   Hgb urine dipstick LARGE (A) NEGATIVE   Bilirubin Urine NEGATIVE NEGATIVE   Ketones, ur NEGATIVE NEGATIVE mg/dL   Protein, ur 859 (A) NEGATIVE mg/dL   Nitrite NEGATIVE NEGATIVE   Leukocytes, UA TRACE (A) NEGATIVE   RBC / HPF 0-5 0 - 5 RBC/hpf   WBC, UA 0-5 0 - 5 WBC/hpf   Bacteria, UA RARE (A) NONE SEEN   Squamous Epithelial / LPF 11-20 0 - 5   Mucus PRESENT   hCG, quantitative, pregnancy     Status: Abnormal   Collection Time: 09/20/17  3:22 PM  Result Value Ref Range   hCG, Beta Chain, Quant, S 101,308 (H) <5 mIU/mL   US Ob Transvaginal  Result Date: 09/20/2017 CLINICAL DATA:  First trimester pregnancy with inconclusive fetal viability. EXAM: TRANSVAGINAL OB ULTRASOUND TECHNIQUE: Transvaginal ultrasound was performed for complete evaluation of the gestation as well as the maternal uterus, adnexal regions, and pelvic cul-de-sac. COMPARISON:  09/12/2017 FINDINGS: Intrauterine gestational sac: Single Yolk sac:  Visualized. Embryo:  Visualized. Cardiac Activity: Visualized. Heart Rate: 149 bpm CRL:   9 mm   6 w 6 d                  [place] EDC: 05/10/2018 Subchorionic hemorrhage:  None visualized. Maternal uterus/adnexae: A 2nd living ectopic pregnancy is seen in the left adnexa, with embryonic heart rate of 161. A moderate amount of blood clot and echogenic free fluid is seen in the left adnexa and pelvic cul-de-sac. IMPRESSION: Heterotopic pregnancy, with both living IUP and living ectopic  pregnancy in the left adnexa. Moderate hemoperitoneum. Critical Value/emergent results were called by telephone at the time of interpretation on 09/20/2017 at 4:36 pm to [facility] , who verbally acknowledged these results. Electronically Signed   By: Myles Rosenthal M.D.   On: 09/20/2017 16:40   MDM HCG US OB Transvaginal CBC, Type and Screen  Dr. Earlene Plater called and updated with ultrasound results- will prepare patient for OR. Dr. Jolayne Panther to come see patient.   Assessment and Plan   1. Left tubal  pregnancy with intrauterine pregnancy    -Prepare for OR -Dr. Jolayne Panther to bedside to discuss plan with patient.   Rolm Bookbinder CNM 09/20/2017, 4:55 PM

## 2017-09-20 NOTE — MAU Note (Signed)
Dr. Jolayne Panther in to discuss plan of care with pt.

## 2017-09-20 NOTE — Telephone Encounter (Signed)
-----   Message from Judeth Horn, NP sent at 09/17/2017  8:07 PM EDT ----- Per Dr. Emelda Fear, patient needs f/u ultrasound for viability in a week. Can you please get this patient set up for an outpatient ultrasound.

## 2017-09-20 NOTE — Anesthesia Procedure Notes (Signed)
Procedure Name: Intubation Date/Time: 09/20/2017 5:48 PM Performed by: Renford Dills, CRNA Pre-anesthesia Checklist: Patient identified, Patient being monitored, Timeout performed, Emergency Drugs available and Suction available Patient Re-evaluated:Patient Re-evaluated prior to induction Oxygen Delivery Method: Circle System Utilized Preoxygenation: Pre-oxygenation with 100% oxygen Induction Type: IV induction, Rapid sequence and Cricoid Pressure applied Laryngoscope Size: Miller and 2 Grade View: Grade II Tube type: Oral Tube size: 7.0 mm Number of attempts: 1 Airway Equipment and Method: stylet Placement Confirmation: ETT inserted through vocal cords under direct vision,  positive ETCO2 and breath sounds checked- equal and bilateral Secured at: 21 cm Tube secured with: Tape Dental Injury: Teeth and Oropharynx as per pre-operative assessment

## 2017-09-20 NOTE — Op Note (Signed)
Angel Thomas PROCEDURE DATE: 09/20/2017  PREOPERATIVE DIAGNOSIS: Ruptured ectopic pregnancy POSTOPERATIVE DIAGNOSIS: Ruptured left fallopian tube ectopic pregnancy PROCEDURE: Laparoscopic left salpingectomy and removal of ectopic pregnancy SURGEON:  Dr. Catalina Antigua ASSISTANT: none ANESTHESIOLOGIST: Lowella Curb, MD Anesthesiologist: Lowella Curb, MD CRNA: Shanon Payor, CRNA; Renford Dills, CRNA  INDICATIONS: 32 y.o. G2P0010 at [redacted]w[redacted]d with heterotopic pregnancy and ruptured ectopic pregnancy. On exam, she had stable vital signs, and an acute abdomen. Hgb  Lab Results  Component Value Date   HGB 11.8 (L) 09/20/2017   , blood type A POS . Patient was counseled regarding need for laparoscopic salpingectomy. Risks of surgery including bleeding which may require transfusion or reoperation, infection, injury to bowel or other surrounding organs, need for additional procedures including laparotomy and other postoperative/anesthesia complications were explained to patient.  Written informed consent was obtained.  FINDINGS:  Moderate amount of hemoperitoneum estimated to be about 200cc of blood and clots.  Dilated left fallopian tube containing ectopic gestation. Small normal appearing uterus,absent right fallopian tube, normal right and left ovaries.  ANESTHESIA: General INTRAVENOUS FLUIDS: .2000 ml ESTIMATED BLOOD LOSS:  200 mL  URINE OUTPUT: 500 ml SPECIMENS: left fallopian tube containing ectopic gestation COMPLICATIONS: None immediate  PROCEDURE IN DETAIL:  The patient was taken to the operating room where general anesthesia was administered and was found to be adequate.  She was placed in supine position, and was prepped and draped in a sterile manner.  A Foley catheter was inserted into her bladder and attached to Angel Thomas drainage. After an adequate timeout was performed, attention was then turned to the patient's abdomen where a 10-mm skin incision was made on the  umbilical fold.  The fascia was identified, tented up and incised with Mayo scissors. The fascia was tagged with 0- Vicryl. The peritoneum was identified tented up and entered sharply with Metzenbaum scissors.  10-mm trocar and sleeve were then advanced without difficulty into the abdomen where intraabdominal placement was confirmed by the laparoscope. Pneumoperitoneum was achieved with insufflation of CO2 gas. A survey of the patient's pelvis and abdomen revealed the findings as above.  Two lower quadrant ports were placed under direct visualization; 5-mm on the inferior right and the other 5-mm was placed 5 cm cephalad from the previous.  The 5-mm Nezhat suction irrigator was then used to suction the hemoperitoneum and irrigate the pelvis.  Attention was then turned to the left fallopian tube which was grasped and ligated from the underlying mesosalpinx and uterine attachment using the Enseal instrument.  Good hemostasis was noted.  The specimen was placed in an EndoCatch bag and removed from the abdomen intact.  The abdomen was desufflated, and all instruments were removed.  The fascial incisions of both 10-mm sites were reapproximated with 0 Vicryl figure-of-eight stiches; and all skin incisions were closed with a 3-0 Vicryl subcuticular stitch. The patient tolerated the procedures well.  All instruments, needles, and sponge counts were correct x 2. The patient was taken to the recovery room in stable condition.   The patient will be discharged to home as per PACU criteria.  Routine postoperative instructions given.  She was prescribed Percocet and Colace.  She will follow up in the clinic in 2 weeks for postoperative evaluation and to start prenatal care.

## 2017-09-20 NOTE — Anesthesia Preprocedure Evaluation (Signed)
Anesthesia Evaluation  Patient identified by MRN, date of birth, ID band Patient awake    Reviewed: Allergy & Precautions, H&P , NPO status , Patient's Chart, lab work & pertinent test results, reviewed documented beta blocker date and time   History of Anesthesia Complications Negative for: history of anesthetic complications  Airway Mallampati: I  TM Distance: >3 FB Neck ROM: full    Dental  (+) Teeth Intact   Pulmonary neg pulmonary ROS, former smoker,    Pulmonary exam normal breath sounds clear to auscultation       Cardiovascular negative cardio ROS   Rhythm:regular Rate:Normal     Neuro/Psych negative neurological ROS  negative psych ROS   GI/Hepatic negative GI ROS, Neg liver ROS,   Endo/Other  negative endocrine ROS  Renal/GU negative Renal ROS  negative genitourinary   Musculoskeletal   Abdominal   Peds  Hematology negative hematology ROS (+)   Anesthesia Other Findings   Reproductive/Obstetrics (+) Pregnancy (5 weeks, ectopic)                             Anesthesia Physical  Anesthesia Plan  ASA: III and emergent  Anesthesia Plan: General ETT, Rapid Sequence and Cricoid Pressure   Post-op Pain Management:    Induction: Intravenous  PONV Risk Score and Plan: 3 and Ondansetron, Dexamethasone and Treatment may vary due to age or medical condition  Airway Management Planned: Oral ETT  Additional Equipment:   Intra-op Plan:   Post-operative Plan: Extubation in OR  Informed Consent: I have reviewed the patients History and Physical, chart, labs and discussed the procedure including the risks, benefits and alternatives for the proposed anesthesia with the patient or authorized representative who has indicated his/her understanding and acceptance.   Dental Advisory Given  Plan Discussed with: CRNA and Surgeon  Anesthesia Plan Comments:         Anesthesia  Quick Evaluation

## 2017-09-20 NOTE — Transfer of Care (Signed)
Immediate Anesthesia Transfer of Care Note  Patient: Angel Thomas  Procedure(s) Performed: LAPAROSCOPIC LEFT SALPINGECTOMY WITH REMOVAL OF ECTOPIC PREGNANCY (N/A Abdomen) LAPAROSCOPY DIAGNOSTIC  Patient Location: PACU  Anesthesia Type:General  Level of Consciousness: awake  Airway & Oxygen Therapy: Patient Spontanous Breathing and Patient connected to nasal cannula oxygen  Post-op Assessment: Report given to RN and Post -op Vital signs reviewed and stable  Post vital signs: stable  Last Vitals:  Vitals Value Taken Time  BP 150/94 09/20/2017  7:01 PM  Temp    Pulse 100 09/20/2017  7:03 PM  Resp 16 09/20/2017  7:03 PM  SpO2 100 % 09/20/2017  7:03 PM  Vitals shown include unvalidated device data.  Last Pain:  Vitals:   09/20/17 1505  TempSrc: Oral  PainSc:          Complications: No apparent anesthesia complications

## 2017-09-20 NOTE — MAU Note (Signed)
Pt c/o abd pain since last night. Had n/v yesterday no vomiting today. Reports some spotting today as well

## 2017-09-20 NOTE — Telephone Encounter (Signed)
Scheduled pt [place] for Wednesday 9/11 @ 0800 at [facility].  Called pt to inform her of appointment time and location and that she needed to come to the clinic after the [place] for the results.  Pt verbalized understanding and then reported that she had left a message with [place] because she was having upper abdominal pain, diarrhea, and vomiting.  She denied any vaginal bleeding or spotting.  Pt states she was on her way to [facility].  Pt had no questions.

## 2017-09-21 ENCOUNTER — Encounter (HOSPITAL_COMMUNITY): Payer: Self-pay | Admitting: Obstetrics and Gynecology

## 2017-09-23 NOTE — Addendum Note (Signed)
Addendum  created 09/23/17 1132 by Lowella Curb, MD   Intraprocedure Staff edited

## 2017-09-23 NOTE — Addendum Note (Signed)
Addendum  created 09/23/17 1254 by Algis Greenhouse, CRNA   Charge Capture section accepted

## 2017-09-25 ENCOUNTER — Ambulatory Visit: Payer: Self-pay | Admitting: General Practice

## 2017-09-25 ENCOUNTER — Ambulatory Visit (HOSPITAL_COMMUNITY)
Admission: RE | Admit: 2017-09-25 | Discharge: 2017-09-25 | Disposition: A | Discharge status: Home / Self Care | Payer: Self-pay | Source: Ambulatory Visit | Attending: Student | Admitting: Student

## 2017-09-25 ENCOUNTER — Encounter: Payer: Self-pay | Admitting: General Practice

## 2017-09-25 DIAGNOSIS — Z3A08 8 weeks gestation of pregnancy: Secondary | ICD-10-CM | POA: Insufficient documentation

## 2017-09-25 DIAGNOSIS — O3680X0 Pregnancy with inconclusive fetal viability, not applicable or unspecified: Secondary | ICD-10-CM | POA: Insufficient documentation

## 2017-09-25 DIAGNOSIS — Z712 Person consulting for explanation of examination or test findings: Secondary | ICD-10-CM

## 2017-09-25 NOTE — Progress Notes (Signed)
Chart reviewed for nurse visit. Agree with plan of care.   Judeth Horn, NP 09/25/2017 10:01 AM

## 2017-09-25 NOTE — Progress Notes (Signed)
Patient presents to office today for viability ultrasound results. Reviewed results with Judeth Horn, who finds single living IUP- patient should begin prenatal care.  Informed patient of results, reviewed dating, & provided pictures. Patient has new OB appt 9/25- will follow up then.

## 2017-10-09 ENCOUNTER — Ambulatory Visit (INDEPENDENT_AMBULATORY_CARE_PROVIDER_SITE_OTHER): Payer: Self-pay | Admitting: Obstetrics & Gynecology

## 2017-10-09 ENCOUNTER — Encounter: Payer: Self-pay | Admitting: Obstetrics & Gynecology

## 2017-10-09 VITALS — BP 141/91 | HR 87 | Wt 302.0 lb

## 2017-10-09 DIAGNOSIS — O10911 Unspecified pre-existing hypertension complicating pregnancy, first trimester: Secondary | ICD-10-CM

## 2017-10-09 DIAGNOSIS — O99211 Obesity complicating pregnancy, first trimester: Secondary | ICD-10-CM

## 2017-10-09 DIAGNOSIS — O99210 Obesity complicating pregnancy, unspecified trimester: Secondary | ICD-10-CM

## 2017-10-09 DIAGNOSIS — O0991 Supervision of high risk pregnancy, unspecified, first trimester: Secondary | ICD-10-CM

## 2017-10-09 DIAGNOSIS — O00112 Left tubal pregnancy with intrauterine pregnancy: Secondary | ICD-10-CM

## 2017-10-09 DIAGNOSIS — O10919 Unspecified pre-existing hypertension complicating pregnancy, unspecified trimester: Secondary | ICD-10-CM | POA: Insufficient documentation

## 2017-10-09 DIAGNOSIS — Z8759 Personal history of other complications of pregnancy, childbirth and the puerperium: Secondary | ICD-10-CM

## 2017-10-09 MED ORDER — ASPIRIN EC 81 MG PO TBEC: 81.0000 mg | DELAYED_RELEASE_TABLET | Freq: Every day | ORAL | 2 refills | Status: DC

## 2017-10-09 NOTE — Patient Instructions (Signed)
Return to clinic for any scheduled appointments or obstetric concerns, or go to [facility] for evaluation   Hypertension During Pregnancy Hypertension, commonly called high blood pressure, is when the force of blood pumping through your arteries is too strong. Arteries are blood vessels that carry blood from the heart throughout the body. Hypertension during pregnancy can cause problems for you and your baby. Your baby may be born early (prematurely) or may not weigh as much as he or she should at birth. Very bad cases of hypertension during pregnancy can be life-threatening. Different types of hypertension can occur during pregnancy. These include:  Chronic hypertension. This happens when: ? You have hypertension before pregnancy and it continues during pregnancy. ? You develop hypertension before you are [redacted] weeks pregnant, and it continues during pregnancy.  Gestational hypertension. This is hypertension that develops after the 20th week of pregnancy.  Preeclampsia, also called toxemia of pregnancy. This is a very serious type of hypertension that develops only during pregnancy. It affects the whole body, and it can be very dangerous for you and your baby.  Gestational hypertension and preeclampsia usually go away within 6 weeks after your baby is born. Women who have hypertension during pregnancy have a greater chance of developing hypertension later in life or during future pregnancies. What are the causes? The exact cause of hypertension is not known. What increases the risk? There are certain factors that make it more likely for you to develop hypertension during pregnancy. These include:  Having hypertension during a previous pregnancy or prior to pregnancy.  Being overweight.  Being older than age 30.  Being pregnant for the first time or being pregnant with more than one baby.  Becoming pregnant using fertilization methods such as IVF (in vitro fertilization).  Having diabetes,  kidney problems, or systemic lupus erythematosus.  Having a family history of hypertension.  What are the signs or symptoms? Chronic hypertension and gestational hypertension rarely cause symptoms. Preeclampsia causes symptoms, which may include:  Increased protein in your urine. Your health care provider will check for this at every visit before you give birth (prenatal visit).  Severe headaches.  Sudden weight gain.  Swelling of the hands, face, legs, and feet.  Nausea and vomiting.  Vision problems, such as blurred or double vision.  Numbness in the face, arms, legs, and feet.  Dizziness.  Slurred speech.  Sensitivity to bright lights.  Abdominal pain.  Convulsions.  How is this diagnosed? You may be diagnosed with hypertension during a routine prenatal exam. At each prenatal visit, you may:  Have a urine test to check for high amounts of protein in your urine.  Have your blood pressure checked. A blood pressure reading is recorded as two numbers, such as "120 over 80" (or 120/80). The first ("top") number is called the systolic pressure. It is a measure of the pressure in your arteries when your heart beats. The second ("bottom") number is called the diastolic pressure. It is a measure of the pressure in your arteries as your heart relaxes between beats. Blood pressure is measured in a unit called mm Hg. A normal blood pressure reading is: ? Systolic: below 120. ? Diastolic: below 80.  The type of hypertension that you are diagnosed with depends on your test results and when your symptoms developed.  Chronic hypertension is usually diagnosed before 20 weeks of pregnancy.  Gestational hypertension is usually diagnosed after 20 weeks of pregnancy.  Hypertension with high amounts of protein in the urine  is diagnosed as preeclampsia.  Blood pressure measurements that stay above 160 systolic, or above 110 diastolic, are signs of severe preeclampsia.  How is this  treated? Treatment for hypertension during pregnancy varies depending on the type of hypertension you have and how serious it is.  If you take medicines called ACE inhibitors to treat chronic hypertension, you may need to switch medicines. ACE inhibitors should not be taken during pregnancy.  If you have gestational hypertension, you may need to take blood pressure medicine.  If you are at risk for preeclampsia, your health care provider may recommend that you take a low-dose aspirin every day to prevent high blood pressure during your pregnancy.  If you have severe preeclampsia, you may need to be hospitalized so you and your baby can be monitored closely. You may also need to take medicine (magnesium sulfate) to prevent seizures and to lower blood pressure. This medicine may be given as an injection or through an IV tube.  In some cases, if your condition gets worse, you may need to deliver your baby early.  Follow these instructions at home: Eating and drinking  Drink enough fluid to keep your urine clear or pale yellow.  Eat a healthy diet that is low in salt (sodium). Do not add salt to your food. Check food labels to see how much sodium a food or beverage contains. Lifestyle  Do not use any products that contain nicotine or tobacco, such as cigarettes and e-cigarettes. If you need help quitting, ask your health care provider.  Do not use alcohol.  Avoid caffeine.  Avoid stress as much as possible. Rest and get plenty of sleep. General instructions  Take over-the-counter and prescription medicines only as told by your health care provider.  While lying down, lie on your left side. This keeps pressure off your baby.  While sitting or lying down, raise (elevate) your feet. Try putting some pillows under your lower legs.  Exercise regularly. Ask your health care provider what kinds of exercise are best for you.  Keep all prenatal and follow-up visits as told by your health  care provider. This is important. Contact a health care provider if:  You have symptoms that your health care provider told you may require more treatment or monitoring, such as: ? Fever. ? Vomiting. ? Headache. Get help right away if:  You have severe abdominal pain or vomiting that does not get better with treatment.  You suddenly develop swelling in your hands, ankles, or face.  You gain 4 lbs (1.8 kg) or more in 1 week.  You develop vaginal bleeding, or you have blood in your urine.  You do not feel your baby moving as much as usual.  You have blurred or double vision.  You have muscle twitching or sudden tightening (spasms).  You have shortness of breath.  Your lips or fingernails turn blue. This information is not intended to replace advice given to you by your health care provider. Make sure you discuss any questions you have with your health care provider. Document Released: 09/19/2010 Document Revised: 07/22/2015 Document Reviewed: 06/17/2015 Elsevier Interactive Patient Education  [employer]2018 [employer] Inc[employer]

## 2017-10-09 NOTE — Progress Notes (Signed)
Subjective:   Angel Thomas is a 32 y.o. G2P0010 at [redacted]w[redacted]d by LMP c/w 8 week scan (5 days different) being seen today for her first obstetrical visit.  Her obstetrical history is significant for having a simultaneous ectopic pregnancy; had left salpingectomy on 10/03/17. Had right salpingectomy in 2013, so she is surgically sterilized. Patient has been doing okay since surgery.  Patient does intend to breast feed. Pregnancy history fully reviewed.  Patient reports no complaints.  HISTORY: OB History  Gravida Para Term Preterm AB Living  2 0 0 0 1 0  SAB TAB Ectopic Multiple Live Births  0 0 1 0 0    # Outcome Date GA Lbr Len/2nd Weight Sex Delivery Anes PTL Lv  2 Current           1 Ectopic              Birth Comments: System Generated. Please review and update pregnancy details.    Last pap smear was done in 2017 and was normal  Past Medical History:  Diagnosis Date  . Ectopic pregnancy 09/20/2017   Had simultaneous IUP; had laparoscopic salpingectomy 09/20/17 for ectopic  . Hypertension   . Seasonal allergies    Past Surgical History:  Procedure Laterality Date  . fallopian tube removed    . LAPAROSCOPIC UNILATERAL SALPINGECTOMY N/A 09/20/2017   Procedure: LAPAROSCOPIC LEFT SALPINGECTOMY WITH REMOVAL OF ECTOPIC PREGNANCY;  Surgeon: Catalina Antigua, MD;  Location: [facility];  Service: Gynecology;  Laterality: N/A;  . LAPAROSCOPY  12/07/2011   Procedure: LAPAROSCOPY OPERATIVE;  Surgeon: Tereso Newcomer, MD;  Location: [facility];  Service: Gynecology;  Laterality: Right;  Right Salpingectomy, Lysis of Adhesions  . LAPAROSCOPY  09/20/2017   Procedure: LAPAROSCOPY DIAGNOSTIC;  Surgeon: Catalina Antigua, MD;  Location: [facility];  Service: Gynecology;;  . TONSILLECTOMY    . WISDOM TOOTH EXTRACTION     Family History  Problem Relation Age of Onset  . Other Neg Hx    Social History   Tobacco Use  . Smoking status: Former Smoker    Last attempt to quit: 01/15/2014    Years since  quitting: 3.7  . Smokeless tobacco: Never Used  Substance Use Topics  . Alcohol use: Yes    Alcohol/week: 0.0 standard drinks  . Drug use: Yes    Types: Marijuana    Comment: Last marijuana 08/31/17   Allergies  Allergen Reactions  . Almond Meal     Throat itches and tongue becomes tingly   Current Outpatient Medications on File Prior to Visit  Medication Sig Dispense Refill  . albuterol (PROVENTIL HFA;VENTOLIN HFA) 108 (90 BASE) MCG/ACT inhaler Inhale 2 puffs into the lungs every 4 (four) hours as needed for wheezing or shortness of breath. 1 Inhaler 0  . Prenat-Fe Poly-Methfol-FA-DHA (VITAFOL ULTRA) 29-0.6-0.4-200 MG CAPS Take 1 tablet by mouth daily. 30 capsule 12  . Prenat-Fe Poly-Methfol-FA-DHA (VITAFOL ULTRA) 29-0.6-0.4-200 MG CAPS Take 1 tablet by mouth daily. 30 capsule 12  . labetalol (NORMODYNE) 200 MG tablet Take 1 tablet (200 mg total) by mouth 2 (two) times daily. (Patient not taking: Reported on 10/09/2017) 60 tablet 3  . oxyCODONE-acetaminophen (PERCOCET/ROXICET) 5-325 MG tablet Take 1-2 tablets by mouth every 6 (six) hours as needed. (Patient not taking: Reported on 10/09/2017) 20 tablet 0   No current facility-administered medications on file prior to visit.     Review of Systems Pertinent items noted in HPI and remainder of comprehensive ROS otherwise negative.  Exam   Vitals:   10/09/17 1357 10/09/17 1407  BP: (!) 152/104 (!) 141/91  Pulse: 96 87  Weight: (!) 302 lb 0.5 oz (137 kg)       General: well-developed, well-nourished female in no acute distress  Breast:  normal appearance, no masses or tenderness  Skin: normal coloration and turgor, no rashes  Neurologic: oriented, normal, negative, normal mood  Extremities: normal strength, tone, and muscle mass, ROM of all joints is normal  HEENT PERRLA, extraocular movement intact and sclera clear, anicteric  Mouth/Teeth mucous membranes moist, pharynx normal without lesions and dental hygiene good  Neck  supple and no masses  Cardiovascular: regular rate and rhythm  Respiratory:  no respiratory distress, normal breath sounds  Abdomen: soft, non-tender; bowel sounds normal; no masses,  no organomegaly. Incisions C/D/I.  Pelvic: deferred     Assessment:   Pregnancy: G2P0010 Patient Active Problem List   Diagnosis Date Noted  . Supervision of high risk pregnancy, antepartum, first trimester 10/09/2017  . S/P ectopic pregnancy x 2 10/09/2017  . Chronic hypertension during pregnancy, antepartum 10/09/2017  . Maternal morbid obesity, antepartum (HCC) 10/09/2017  . Left tubal pregnancy with intrauterine pregnancy      Plan:  1. Chronic hypertension during pregnancy, antepartum Did not take Labetalol today, advised to take as prescribed.Discused need for surveillance, multiple scans, prevention of preeclampsia etc. Baseline labs ordered. - aspirin EC 81 MG tablet; Take 1 tablet (81 mg total) by mouth daily. Take after 12 weeks for prevention of preeclampsia later in pregnancy  Dispense: 300 tablet; Refill: 2 - Comprehensive metabolic panel - Hemoglobin A1c - Protein / creatinine ratio, urine  2. Maternal morbid obesity, antepartum (HCC) Will monitor weight gain, recommended 11-20 lbs.  3. Left tubal pregnancy with intrauterine pregnancy 4. S/P ectopic pregnancy Doing well.  5. Supervision of high risk pregnancy, antepartum, first trimester Routine prenatal labs done.  - [facility] AMB BABYSCRIPTS OPT IN - Culture, OB Urine - Obstetric Panel, Including HIV - [place] MFM OB DETAIL +14 WK; Future Continue prenatal vitamins. Genetic Screening discussed, First trimester screen, Quad screen and NIPS: declined for now. Ultrasound discussed; fetal anatomic survey: ordered. Problem list reviewed and updated. The nature of [facility] with multiple MDs and other Advanced Practice Providers was explained to patient; also emphasized that residents, students are part of  our team. Routine obstetric precautions reviewed. Return in about 4 weeks (around 11/06/2017) for OB Visit (HOB).     Jaynie Collins, MD, FACOG Obstetrician & Gynecologist, [facility] for [employer], [facility]

## 2017-10-10 LAB — COMPREHENSIVE METABOLIC PANEL
A/G RATIO: 1.3 (ref 1.2–2.2)
ALK PHOS: 73 IU/L (ref 39–117)
ALT: 13 IU/L (ref 0–32)
AST: 11 IU/L (ref 0–40)
Albumin: 3.8 g/dL (ref 3.5–5.5)
BUN/Creatinine Ratio: 5 — ABNORMAL LOW (ref 9–23)
BUN: 4 mg/dL — ABNORMAL LOW (ref 6–20)
Bilirubin Total: 0.2 mg/dL (ref 0.0–1.2)
CO2: 25 mmol/L (ref 20–29)
Calcium: 9.5 mg/dL (ref 8.7–10.2)
Chloride: 99 mmol/L (ref 96–106)
Creatinine, Ser: 0.79 mg/dL (ref 0.57–1.00)
GFR calc Af Amer: 116 mL/min/{1.73_m2} (ref >59)
GFR calc non Af Amer: 101 mL/min/{1.73_m2} (ref >59)
GLOBULIN, TOTAL: 3 g/dL (ref 1.5–4.5)
Glucose: 92 mg/dL (ref 65–99)
POTASSIUM: 3.8 mmol/L (ref 3.5–5.2)
SODIUM: 137 mmol/L (ref 134–144)
Total Protein: 6.8 g/dL (ref 6.0–8.5)

## 2017-10-10 LAB — OBSTETRIC PANEL, INCLUDING HIV
Antibody Screen: NEGATIVE
BASOS ABS: 0.1 10*3/uL (ref 0.0–0.2)
Basos: 1 %
EOS (ABSOLUTE): 0.2 10*3/uL (ref 0.0–0.4)
EOS: 2 %
HEMOGLOBIN: 11 g/dL — AB (ref 11.1–15.9)
HEP B S AG: NEGATIVE
HIV SCREEN 4TH GENERATION: NONREACTIVE
Hematocrit: 33.8 % — ABNORMAL LOW (ref 34.0–46.6)
IMMATURE GRANS (ABS): 0.1 10*3/uL (ref 0.0–0.1)
IMMATURE GRANULOCYTES: 1 %
LYMPHS ABS: 2.8 10*3/uL (ref 0.7–3.1)
Lymphs: 28 %
MCH: 29.2 pg (ref 26.6–33.0)
MCHC: 32.5 g/dL (ref 31.5–35.7)
MCV: 90 fL (ref 79–97)
MONOCYTES: 8 %
Monocytes Absolute: 0.9 10*3/uL (ref 0.1–0.9)
NEUTROS PCT: 60 %
Neutrophils Absolute: 6.1 10*3/uL (ref 1.4–7.0)
PLATELETS: 287 10*3/uL (ref 150–450)
RBC: 3.77 x10E6/uL (ref 3.77–5.28)
RDW: 13 % (ref 12.3–15.4)
RPR: NONREACTIVE
Rh Factor: POSITIVE
Rubella Antibodies, IGG: 3.48 index (ref >0.99)
WBC: 10.1 10*3/uL (ref 3.4–10.8)

## 2017-10-10 LAB — PROTEIN / CREATININE RATIO, URINE
Creatinine, Urine: 27.4 mg/dL
Protein, Ur: <4 mg/dL

## 2017-10-10 LAB — HEMOGLOBIN A1C
ESTIMATED AVERAGE GLUCOSE: 97 mg/dL
HEMOGLOBIN A1C: 5 % (ref 4.8–5.6)

## 2017-10-11 LAB — CULTURE, OB URINE

## 2017-10-11 LAB — URINE CULTURE, OB REFLEX

## 2017-11-06 ENCOUNTER — Encounter: Payer: Self-pay | Admitting: Family Medicine

## 2017-11-06 ENCOUNTER — Ambulatory Visit (INDEPENDENT_AMBULATORY_CARE_PROVIDER_SITE_OTHER): Payer: Self-pay | Admitting: Family Medicine

## 2017-11-06 DIAGNOSIS — O10919 Unspecified pre-existing hypertension complicating pregnancy, unspecified trimester: Secondary | ICD-10-CM

## 2017-11-06 DIAGNOSIS — O0991 Supervision of high risk pregnancy, unspecified, first trimester: Secondary | ICD-10-CM

## 2017-11-06 LAB — POCT URINALYSIS DIP (DEVICE)
Bilirubin Urine: NEGATIVE
GLUCOSE, UA: NEGATIVE mg/dL
Hgb urine dipstick: NEGATIVE
Ketones, ur: NEGATIVE mg/dL
LEUKOCYTES UA: NEGATIVE
Nitrite: NEGATIVE
PROTEIN: NEGATIVE mg/dL
Specific Gravity, Urine: <=1.005 (ref 1.005–1.030)
UROBILINOGEN UA: 0.2 mg/dL (ref 0.0–1.0)
pH: 5.5 (ref 5.0–8.0)

## 2017-11-06 MED ORDER — LABETALOL HCL 200 MG PO TABS: 200.0000 mg | ORAL_TABLET | Freq: Two times a day (BID) | ORAL | 3 refills | Status: DC

## 2017-11-06 MED ORDER — ASPIRIN EC 81 MG PO TBEC: 81.0000 mg | DELAYED_RELEASE_TABLET | Freq: Every day | ORAL | 2 refills | Status: AC

## 2017-11-06 NOTE — Patient Instructions (Signed)

## 2017-11-06 NOTE — Progress Notes (Signed)
   PRENATAL VISIT NOTE  Subjective:  Angel Thomas is a 32 y.o. G2P0010 at [redacted]w[redacted]d being seen today for ongoing prenatal care.  She is currently monitored for the following issues for this high-risk pregnancy and has Left tubal pregnancy with intrauterine pregnancy; Supervision of high risk pregnancy, antepartum, first trimester; S/P ectopic pregnancy x 2; Chronic hypertension during pregnancy, antepartum; and Maternal morbid obesity, antepartum (HCC) on their problem list.  Patient reports no complaints.  Contractions: Not present. Vag. Bleeding: None.   . Denies leaking of fluid.   The following portions of the patient's history were reviewed and updated as appropriate: allergies, current medications, past family history, past medical history, past social history, past surgical history and problem list. Problem list updated.  Objective:   Vitals:   11/06/17 1632 11/06/17 1633  BP: (!) 157/101 (!) 166/108  Pulse: 97   Weight: (!) 303 lb 11.2 oz (137.8 kg)     Fetal Status:           General:  Alert, oriented and cooperative. Patient is in no acute distress.  Skin: Skin is warm and dry. No rash noted.   Cardiovascular: Normal heart rate noted  Respiratory: Normal respiratory effort, no problems with respiration noted  Abdomen: Soft, gravid, appropriate for gestational age.  Pain/Pressure: Absent     Pelvic: Cervical exam deferred        Extremities: Normal range of motion.  Edema: Mild pitting, slight indentation  Mental Status: Normal mood and affect. Normal behavior. Normal judgment and thought content.   Assessment and Plan:  Pregnancy: G2P0010 at [redacted]w[redacted]d  1. Supervision of high risk pregnancy, antepartum, second trimester Desires NIPT--will come back for this.  2. Chronic hypertension during pregnancy, antepartum Begin Labetalol and ASA--has insurance worked out. Advised risks of elevated BPs DASH diet. - labetalol (NORMODYNE) 200 MG tablet; Take 1 tablet (200 mg total) by  mouth 2 (two) times daily.  Dispense: 60 tablet; Refill: 3 - aspirin EC 81 MG tablet; Take 1 tablet (81 mg total) by mouth daily. Take after 12 weeks for prevention of preeclampsia later in pregnancy  Dispense: 300 tablet; Refill: 2  Term labor symptoms and general obstetric precautions including but not limited to vaginal bleeding, contractions, leaking of fluid and fetal movement were reviewed in detail with the patient. Please refer to After Visit Summary for other counseling recommendations.  Return in 4 weeks (on 12/04/2017).  Future Appointments  Date Time Provider Department Center  11/27/2017  1:45 PM [facility] [place] 2 [facility]  12/04/2017  3:55 PM Reva Bores, MD [facility]    Reva Bores, MD

## 2017-11-20 ENCOUNTER — Encounter (HOSPITAL_COMMUNITY): Payer: Self-pay

## 2017-11-27 ENCOUNTER — Other Ambulatory Visit: Payer: Self-pay | Admitting: Obstetrics & Gynecology

## 2017-11-27 ENCOUNTER — Ambulatory Visit (HOSPITAL_COMMUNITY)
Admission: RE | Admit: 2017-11-27 | Discharge: 2017-11-27 | Disposition: A | Discharge status: Home / Self Care | Payer: No Typology Code available for payment source | Source: Ambulatory Visit | Attending: Obstetrics & Gynecology | Admitting: Obstetrics & Gynecology

## 2017-11-27 DIAGNOSIS — O0991 Supervision of high risk pregnancy, unspecified, first trimester: Secondary | ICD-10-CM

## 2017-11-27 DIAGNOSIS — Z3686 Encounter for antenatal screening for cervical length: Secondary | ICD-10-CM | POA: Insufficient documentation

## 2017-11-27 DIAGNOSIS — Z3A17 17 weeks gestation of pregnancy: Secondary | ICD-10-CM | POA: Diagnosis not present

## 2017-11-27 DIAGNOSIS — Z363 Encounter for antenatal screening for malformations: Secondary | ICD-10-CM | POA: Insufficient documentation

## 2017-11-27 DIAGNOSIS — O99212 Obesity complicating pregnancy, second trimester: Secondary | ICD-10-CM | POA: Insufficient documentation

## 2017-11-27 DIAGNOSIS — O2692 Pregnancy related conditions, unspecified, second trimester: Secondary | ICD-10-CM | POA: Diagnosis not present

## 2017-11-28 ENCOUNTER — Other Ambulatory Visit (HOSPITAL_COMMUNITY): Payer: Self-pay | Admitting: *Deleted

## 2017-11-28 DIAGNOSIS — O99212 Obesity complicating pregnancy, second trimester: Secondary | ICD-10-CM

## 2017-12-04 ENCOUNTER — Ambulatory Visit (INDEPENDENT_AMBULATORY_CARE_PROVIDER_SITE_OTHER): Payer: PRIVATE HEALTH INSURANCE | Admitting: Family Medicine

## 2017-12-04 VITALS — BP 155/96 | HR 101 | Wt 308.0 lb

## 2017-12-04 DIAGNOSIS — O10919 Unspecified pre-existing hypertension complicating pregnancy, unspecified trimester: Secondary | ICD-10-CM

## 2017-12-04 DIAGNOSIS — O0991 Supervision of high risk pregnancy, unspecified, first trimester: Secondary | ICD-10-CM

## 2017-12-04 DIAGNOSIS — Z3A19 19 weeks gestation of pregnancy: Secondary | ICD-10-CM

## 2017-12-04 DIAGNOSIS — O0992 Supervision of high risk pregnancy, unspecified, second trimester: Secondary | ICD-10-CM

## 2017-12-04 DIAGNOSIS — O10912 Unspecified pre-existing hypertension complicating pregnancy, second trimester: Secondary | ICD-10-CM

## 2017-12-04 LAB — POCT URINALYSIS DIP (DEVICE)
Bilirubin Urine: NEGATIVE
GLUCOSE, UA: NEGATIVE mg/dL
Hgb urine dipstick: NEGATIVE
KETONES UR: NEGATIVE mg/dL
Leukocytes, UA: NEGATIVE
NITRITE: NEGATIVE
PROTEIN: NEGATIVE mg/dL
Urobilinogen, UA: 0.2 mg/dL (ref 0.0–1.0)
pH: 5.5 (ref 5.0–8.0)

## 2017-12-04 MED ORDER — LABETALOL HCL 300 MG PO TABS: 300.0000 mg | ORAL_TABLET | Freq: Two times a day (BID) | ORAL | 3 refills | Status: AC

## 2017-12-04 MED ORDER — VITAFOL ULTRA 29-0.6-0.4-200 MG PO CAPS: 1.0000 | ORAL_CAPSULE | Freq: Every day | ORAL | 3 refills | Status: AC

## 2017-12-04 NOTE — Patient Instructions (Signed)

## 2017-12-05 NOTE — Progress Notes (Signed)
   PRENATAL VISIT NOTE  Subjective:  Angel Thomas is a 32 y.o. G2P0010 at 8123w0d being seen today for ongoing prenatal care.  She is currently monitored for the following issues for this high-risk pregnancy and has Left tubal pregnancy with intrauterine pregnancy; Supervision of high risk pregnancy, antepartum, first trimester; S/P ectopic pregnancy x 2; Chronic hypertension during pregnancy, antepartum; and Maternal morbid obesity, antepartum (HCC) on their problem list.  Patient reports no complaints.  Contractions: Not present. Vag. Bleeding: None.  Movement: Absent. Denies leaking of fluid.   The following portions of the patient's history were reviewed and updated as appropriate: allergies, current medications, past family history, past medical history, past social history, past surgical history and problem list. Problem list updated.  Objective:   Vitals:   12/04/17 1604 12/04/17 1614  BP: (!) 146/94 (!) 155/96  Pulse: (!) 101   Weight: (!) 308 lb (139.7 kg)     Fetal Status:     Movement: Absent     General:  Alert, oriented and cooperative. Patient is in no acute distress.  Skin: Skin is warm and dry. No rash noted.   Cardiovascular: Normal heart rate noted  Respiratory: Normal respiratory effort, no problems with respiration noted  Abdomen: Soft, gravid, appropriate for gestational age.  Pain/Pressure: Absent     Pelvic: Cervical exam deferred        Extremities: Normal range of motion.  Edema: Trace  Mental Status: Normal mood and affect. Normal behavior. Normal judgment and thought content.   Assessment and Plan:  Pregnancy: G2P0010 at 2623w0d  1. Supervision of high risk pregnancy, antepartum, first trimester 2 VC-on anatomy scan--o/w no issues - Prenat-Fe Poly-Methfol-FA-DHA (VITAFOL ULTRA) 29-0.6-0.4-200 MG CAPS; Take 1 tablet by mouth daily.  Dispense: 90 capsule; Refill: 3  2. Chronic hypertension during pregnancy, antepartum Continue labetalol--Increase dose due  to persistently elevated BP's Continue--start taking ASA--risks reviewed. - labetalol (NORMODYNE) 300 MG tablet; Take 1 tablet (300 mg total) by mouth 2 (two) times daily.  Dispense: 180 tablet; Refill: 3  General obstetric precautions including but not limited to vaginal bleeding, contractions, leaking of fluid and fetal movement were reviewed in detail with the patient. Please refer to After Visit Summary for other counseling recommendations.  Return in 4 weeks (on 01/01/2018).  Future Appointments  Date Time Provider Department Center  12/25/2017  3:45 PM [facility] US 2 [facility]  01/01/2018  4:15 PM Reva BoresPratt, Caridad Silveira S, [facility]    Reva Boresanya S Ardine Iacovelli, MD

## 2017-12-25 ENCOUNTER — Encounter (HOSPITAL_COMMUNITY): Payer: Self-pay

## 2017-12-25 ENCOUNTER — Ambulatory Visit (HOSPITAL_COMMUNITY)
Admission: RE | Admit: 2017-12-25 | Discharge: 2017-12-25 | Disposition: A | Discharge status: Home / Self Care | Payer: No Typology Code available for payment source | Source: Ambulatory Visit | Attending: Obstetrics & Gynecology | Admitting: Obstetrics & Gynecology

## 2017-12-25 DIAGNOSIS — Z362 Encounter for other antenatal screening follow-up: Secondary | ICD-10-CM | POA: Insufficient documentation

## 2017-12-25 DIAGNOSIS — O10919 Unspecified pre-existing hypertension complicating pregnancy, unspecified trimester: Secondary | ICD-10-CM

## 2017-12-25 DIAGNOSIS — O0991 Supervision of high risk pregnancy, unspecified, first trimester: Secondary | ICD-10-CM

## 2017-12-25 DIAGNOSIS — O99210 Obesity complicating pregnancy, unspecified trimester: Secondary | ICD-10-CM

## 2017-12-25 DIAGNOSIS — Z3A21 21 weeks gestation of pregnancy: Secondary | ICD-10-CM | POA: Insufficient documentation

## 2017-12-25 DIAGNOSIS — Z8759 Personal history of other complications of pregnancy, childbirth and the puerperium: Secondary | ICD-10-CM

## 2017-12-25 DIAGNOSIS — O2692 Pregnancy related conditions, unspecified, second trimester: Secondary | ICD-10-CM | POA: Insufficient documentation

## 2017-12-25 DIAGNOSIS — O99212 Obesity complicating pregnancy, second trimester: Secondary | ICD-10-CM | POA: Diagnosis not present

## 2017-12-26 ENCOUNTER — Other Ambulatory Visit (HOSPITAL_COMMUNITY): Payer: Self-pay | Admitting: *Deleted

## 2017-12-26 ENCOUNTER — Ambulatory Visit (HOSPITAL_COMMUNITY)
Admission: RE | Admit: 2017-12-26 | Discharge: 2017-12-26 | Disposition: A | Discharge status: Home / Self Care | Payer: No Typology Code available for payment source | Source: Ambulatory Visit | Attending: Family Medicine | Admitting: Family Medicine

## 2017-12-26 DIAGNOSIS — Z029 Encounter for administrative examinations, unspecified: Secondary | ICD-10-CM | POA: Insufficient documentation

## 2017-12-26 DIAGNOSIS — Q203 Discordant ventriculoarterial connection: Secondary | ICD-10-CM

## 2017-12-28 ENCOUNTER — Other Ambulatory Visit: Payer: Self-pay

## 2018-01-01 ENCOUNTER — Ambulatory Visit (HOSPITAL_COMMUNITY)
Admission: RE | Admit: 2018-01-01 | Discharge: 2018-01-01 | Disposition: A | Discharge status: Home / Self Care | Payer: No Typology Code available for payment source | Source: Ambulatory Visit | Attending: Family Medicine | Admitting: Family Medicine

## 2018-01-01 ENCOUNTER — Encounter: Payer: Self-pay | Admitting: Family Medicine

## 2018-01-01 ENCOUNTER — Ambulatory Visit (INDEPENDENT_AMBULATORY_CARE_PROVIDER_SITE_OTHER): Payer: No Typology Code available for payment source | Admitting: Family Medicine

## 2018-01-01 ENCOUNTER — Encounter (HOSPITAL_COMMUNITY): Payer: Self-pay

## 2018-01-01 VITALS — BP 126/85 | HR 69 | Wt 310.2 lb

## 2018-01-01 DIAGNOSIS — O10919 Unspecified pre-existing hypertension complicating pregnancy, unspecified trimester: Secondary | ICD-10-CM

## 2018-01-01 DIAGNOSIS — Z3A22 22 weeks gestation of pregnancy: Secondary | ICD-10-CM | POA: Diagnosis not present

## 2018-01-01 DIAGNOSIS — O99210 Obesity complicating pregnancy, unspecified trimester: Secondary | ICD-10-CM

## 2018-01-01 DIAGNOSIS — O359XX0 Maternal care for (suspected) fetal abnormality and damage, unspecified, not applicable or unspecified: Secondary | ICD-10-CM

## 2018-01-01 DIAGNOSIS — O99212 Obesity complicating pregnancy, second trimester: Secondary | ICD-10-CM | POA: Insufficient documentation

## 2018-01-01 DIAGNOSIS — O0991 Supervision of high risk pregnancy, unspecified, first trimester: Secondary | ICD-10-CM

## 2018-01-01 DIAGNOSIS — O358XX0 Maternal care for other (suspected) fetal abnormality and damage, not applicable or unspecified: Secondary | ICD-10-CM | POA: Insufficient documentation

## 2018-01-01 DIAGNOSIS — Z362 Encounter for other antenatal screening follow-up: Secondary | ICD-10-CM | POA: Diagnosis present

## 2018-01-01 DIAGNOSIS — O0992 Supervision of high risk pregnancy, unspecified, second trimester: Secondary | ICD-10-CM

## 2018-01-01 DIAGNOSIS — O10912 Unspecified pre-existing hypertension complicating pregnancy, second trimester: Secondary | ICD-10-CM

## 2018-01-01 DIAGNOSIS — Z8759 Personal history of other complications of pregnancy, childbirth and the puerperium: Secondary | ICD-10-CM

## 2018-01-01 DIAGNOSIS — Q203 Discordant ventriculoarterial connection: Secondary | ICD-10-CM

## 2018-01-01 NOTE — Progress Notes (Signed)
   PRENATAL VISIT NOTE  Subjective:  Angel Thomas is a 32 y.o. G2P0010 at 593w6d being seen today for ongoing prenatal care.  She is currently monitored for the following issues for this high-risk pregnancy and has Left tubal pregnancy with intrauterine pregnancy; Supervision of high risk pregnancy, antepartum, first trimester; S/P ectopic pregnancy x 2; Chronic hypertension during pregnancy, antepartum; and Maternal morbid obesity, antepartum (HCC) on their problem list.  Patient reports no complaints.  Contractions: Not present. Vag. Bleeding: None.  Movement: Present. Denies leaking of fluid.   The following portions of the patient's history were reviewed and updated as appropriate: allergies, current medications, past family history, past medical history, past social history, past surgical history and problem list. Problem list updated.  Objective:   Vitals:   01/01/18 1654 01/01/18 1701  BP: (!) 141/88 126/85  Pulse: 69 69  Weight: (!) 310 lb 3.2 oz (140.7 kg)     Fetal Status: Fetal Heart Rate (bpm): 159   Movement: Present     General:  Alert, oriented and cooperative. Patient is in no acute distress.  Skin: Skin is warm and dry. No rash noted.   Cardiovascular: Normal heart rate noted  Respiratory: Normal respiratory effort, no problems with respiration noted  Abdomen: Soft, gravid, appropriate for gestational age.  Pain/Pressure: Absent     Pelvic: Cervical exam deferred        Extremities: Normal range of motion.  Edema: Trace  Mental Status: Normal mood and affect. Normal behavior. Normal judgment and thought content.   Assessment and Plan:  Pregnancy: G2P0010 at 543w6d  1. Chronic hypertension during pregnancy, antepartum BP is still high Continue Labetalol and ASA  2. Supervision of high risk pregnancy, antepartum, first trimester Has persistent right umbilical vein on anatomy us Pt. [place] to [place] and to be around family. May get  records.  General obstetric precautions including but not limited to vaginal bleeding, contractions, leaking of fluid and fetal movement were reviewed in detail with the patient. Please refer to After Visit Summary for other counseling recommendations.  Return in 4 weeks (on 01/29/2018).  Future Appointments  Date Time Provider Department Center  01/29/2018 12:45 PM [facility] US 5 [facility]    Reva Boresanya S Harriett Azar, MD

## 2018-01-01 NOTE — Progress Notes (Signed)
Patient did not keep appointment today. She will be called to reschedule.  

## 2018-01-01 NOTE — Patient Instructions (Signed)

## 2018-01-02 ENCOUNTER — Other Ambulatory Visit (HOSPITAL_COMMUNITY): Payer: Self-pay | Admitting: *Deleted

## 2018-01-02 DIAGNOSIS — Z362 Encounter for other antenatal screening follow-up: Secondary | ICD-10-CM

## 2018-01-06 ENCOUNTER — Other Ambulatory Visit: Payer: Self-pay

## 2018-01-29 ENCOUNTER — Encounter (HOSPITAL_COMMUNITY): Payer: Self-pay

## 2018-01-29 ENCOUNTER — Ambulatory Visit (HOSPITAL_COMMUNITY): Admission: RE | Admit: 2018-01-29 | Payer: No Typology Code available for payment source | Source: Ambulatory Visit

## 2018-02-24 ENCOUNTER — Telehealth: Payer: Self-pay | Admitting: Family Medicine

## 2018-02-24 NOTE — Telephone Encounter (Signed)
Attempted to call pt about record request we received from [place] [facility]. No answer, rung for several minutes before hanging up. Could not leave voicemail.

## 2018-07-24 ENCOUNTER — Encounter (HOSPITAL_COMMUNITY): Payer: Self-pay

## 2019-01-18 IMAGING — US US OB TRANSVAGINAL
1 series · 14 of 14 positions shown · non-contrast
Comparison: None.

CLINICAL DATA: Pregnant, fetal viability

EXAM:
TRANSVAGINAL OB ULTRASOUND
TECHNIQUE: Transvaginal ultrasound was performed for complete evaluation of the
gestation as well as the maternal uterus, adnexal regions, and
pelvic cul-de-sac.

[Series 1: us ob transvaginal · 14 acquisitions, 14 frames shown]
[im 1/14]
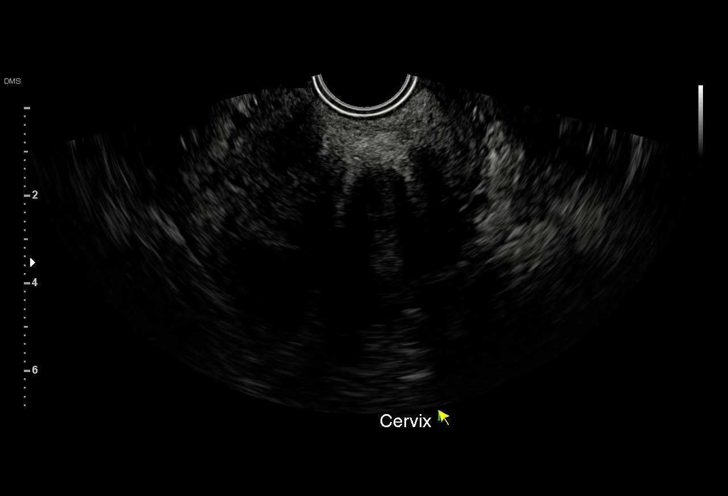
[im 2/14]
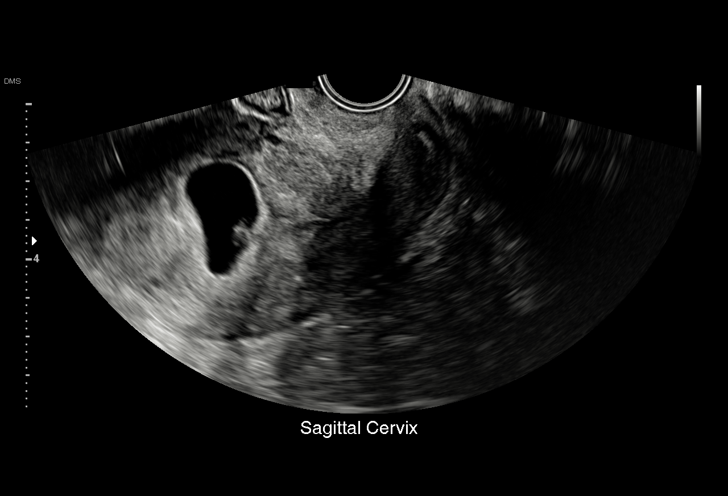
[im 3/14]
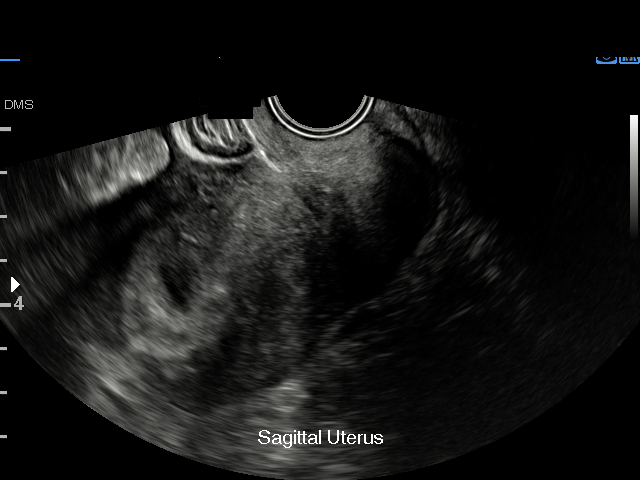
[im 4/14]
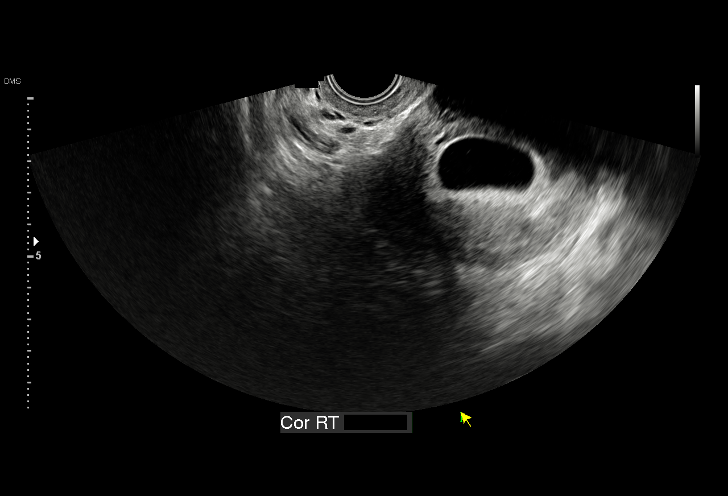
[im 5/14]
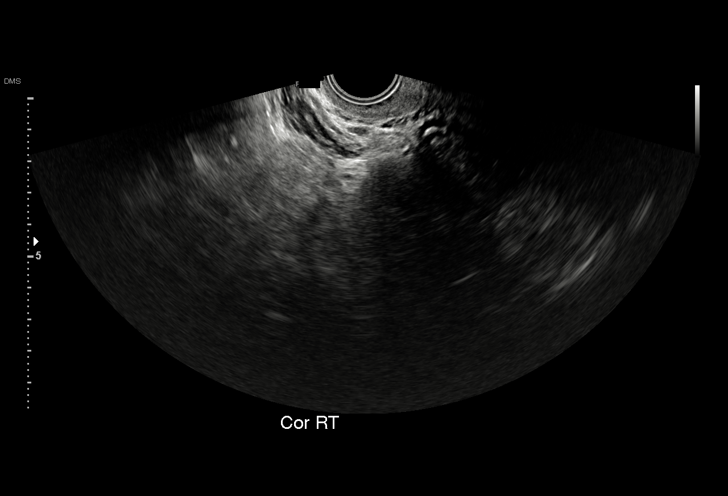
[im 6/14]
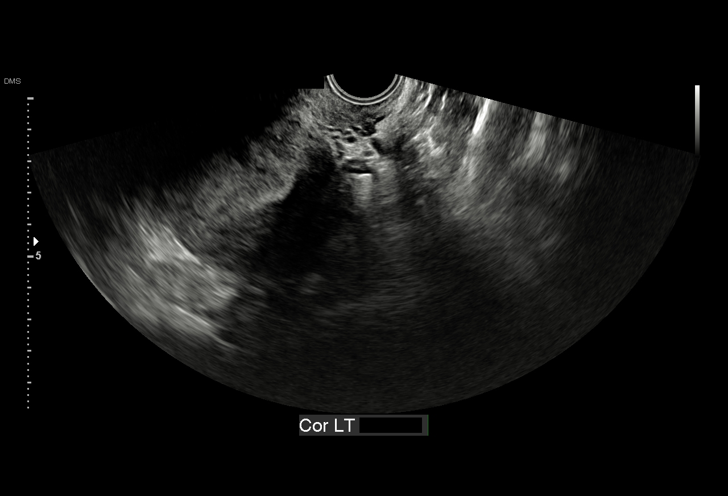
[im 7/14]
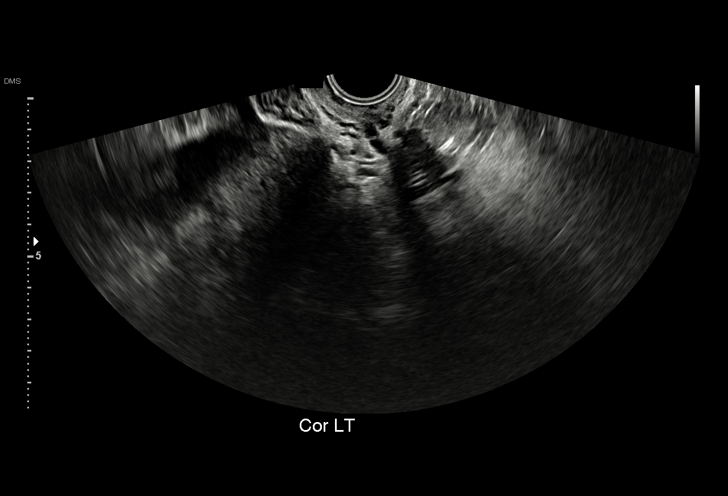
[im 8/14]
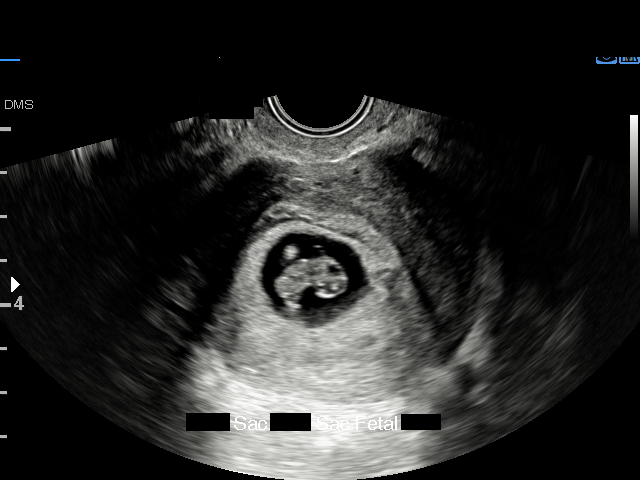
[im 9/14]
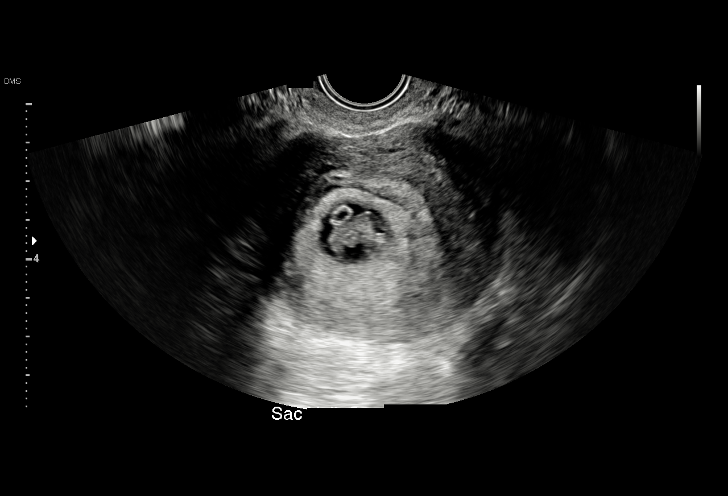
[im 10/14]
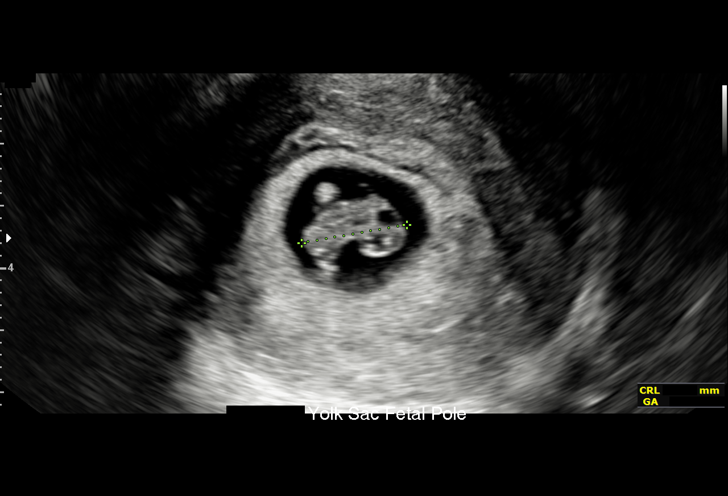
[im 11/14]
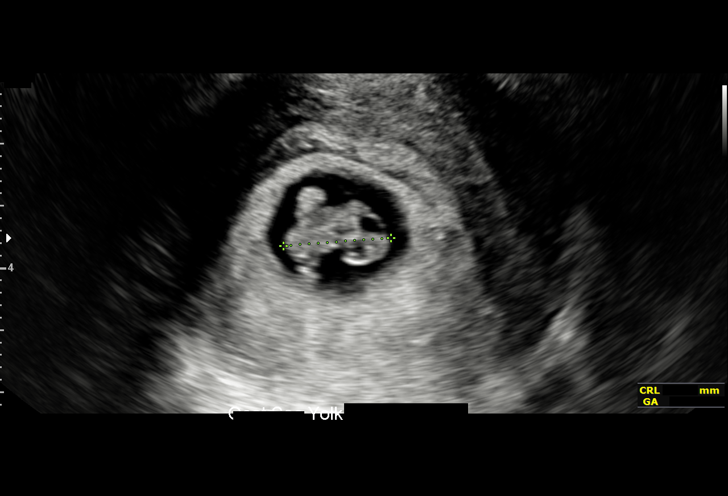
[im 12/14]
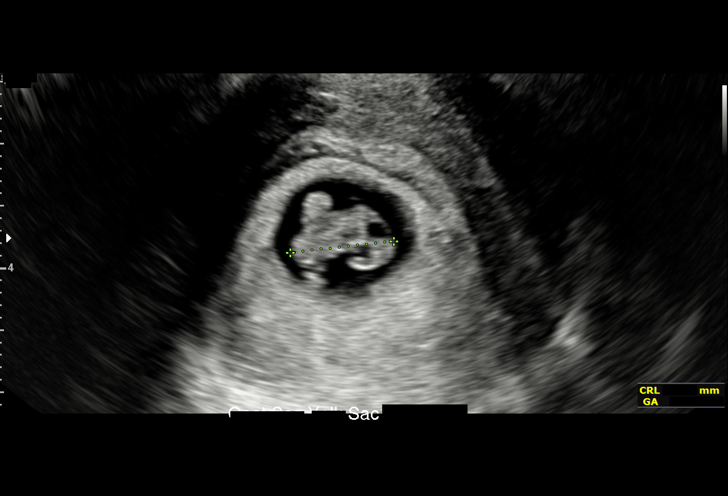
[im 13/14]
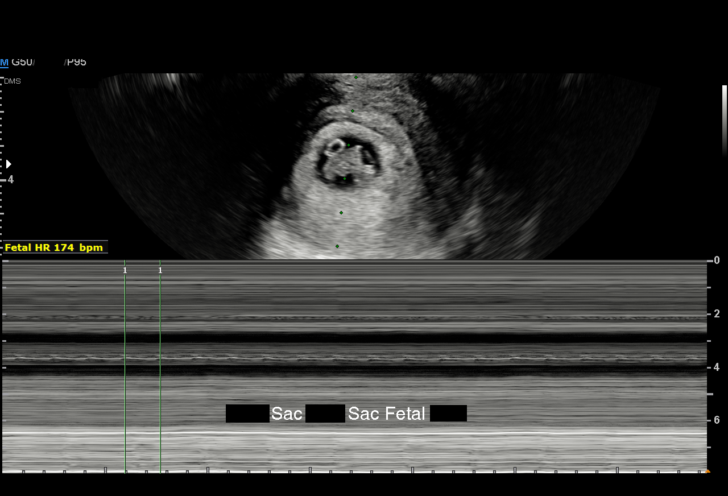
[im 14/14]
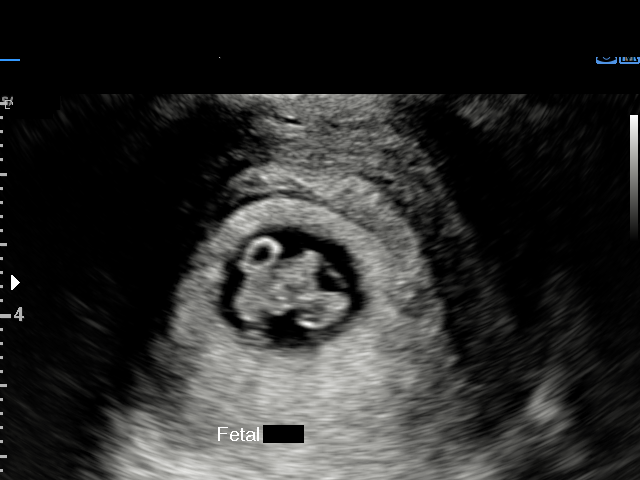

[14 of 14 positions shown; findings below may reference images not displayed]

FINDINGS: Intrauterine gestational sac: Single

Yolk sac:  Visualized.

Embryo:  Visualized.

Cardiac Activity: Visualized.

Heart Rate: 174 bpm

CRL:   17.0 mm   8 w 1 d                  US EDC: 05/06/2018

Subchorionic hemorrhage:  None visualized.

Maternal uterus/adnexae: Bilateral ovaries are not discretely
visualized.

No free fluid.
IMPRESSION: Single live intrauterine gestation, with estimated gestational age 8
weeks 1 day by crown-rump length, as above.

## 2019-01-21 ENCOUNTER — Encounter (INDEPENDENT_AMBULATORY_CARE_PROVIDER_SITE_OTHER): Payer: Self-pay | Admitting: Family

## 2019-01-21 ENCOUNTER — Ambulatory Visit (INDEPENDENT_AMBULATORY_CARE_PROVIDER_SITE_OTHER): Payer: Commercial Managed Care - POS | Admitting: Family

## 2019-01-21 VITALS — BP 145/90 | HR 78 | Temp 97.6°F | Ht 68.0 in | Wt 290.0 lb

## 2019-01-21 DIAGNOSIS — Z20822 Contact with and (suspected) exposure to covid-19: Secondary | ICD-10-CM

## 2019-01-21 NOTE — Patient Instructions (Addendum)
You should quarantine in the home setting for 14 days. A negative test today does NOT mean that you cannot develop symptoms and be positive for covid-19 in the coming days.     If you develop any chest pain, shortness of breath, uncontrolled fever >104F, or any other concerning symptoms then please go to the nearest emergency room.    You have been referred to the [facility]. All patients must have a positive COVID-19 test prior scheduling their Extended COVID-19 Care Clinic telemedicine assessment.  Please give [place] a call at  [phone] as soon as you have a confirmed positive COVID test.  The fastest way you can view your results is by signing up for MyChart at [email or web address]    During your telemedicine assessment, a provider will assess your condition and make further recommendations with regard to your care. The provider will:       Assess to see if you are clinically eligible for monoclonal antibody therapy and arrange for an infusion if you qualify and consent to the treatment   Arrange an in-person evaluation at one of our clinic sites or direct you to the emergency room if needed   Continue your treatment until you are stable enough to return back into the care of your primary care physician.     If you were given an oxygen pulse oximeter or already have one at home, check your oxygen level at least twice a day while at rest as well as if you develop worsening or the onset of shortness of breath. Review those readings with your provider during your telemedicine visit. If your oxygen saturation remains higher than 90%, you can continue to self-monitor and discuss any changes to your care plan during your next follow up visit.  If your oxygen level drops below 90%, go to the emergency room as soon as possible.       If at any point you develop severe chest pain, severe shortness of breath, confusion or any other concerning symptom that warrants immediate  evaluation, please go the nearest emergency room for an assessment.       Follow-up Steps for Patients with Pending COVID-19 Testing - 07/25/2018     Thank you for choosing [facility]. During your visit you received a test for COVID-19 and the test performed takes at least 3-5 days to result. The goal for results is 3-5 days, however with the current increase in testing volumes, tests can take up to 10 days for results to be ready. We appreciate your patience.      What should I do while I am waiting for my test results?    Take good care of yourself and be mindful of the safety of those around you:    Rest and stay well hydrated.    Use acetaminophen (Tylenol) to help manage fever.    Wash your hands often.    Stay at home except to get medical care.    Call ahead before seeing your doctor, any other medical provider, or seeking medical care at any healthcare facility.    Isolate yourself as much as possible.    Clean all "high touch" surfaces every day.    Most people with COVID-19 infection will get better; however, a small number will develop worsening infection which may require more intensive treatment. Please contact your doctor or present to the nearest Emergency Department if you develop the warning signs of more severe infection such as:  o A fever over 102 not controlled by acetaminophen (Tylenol)   o Shortness of breath or chest pain   o Worsening cough   o Increasing weakness   o Inability to tolerate oral fluids    Some general things to think about:   o If you have a medical emergency and need to call 911, notify the person answering the phone that you have, or are being evaluated for COVID-19. If possible, put on a facemask before the ambulance arrives.   o Persons who are placed under active monitoring or facilitated self-monitoring should follow instructions provided by their local health department or occupational health professionals, as appropriate    When will I know the  results of my COVID test?     After a minimum of 3-5 days has passed, there are multiple ways in which you can be informed about the results of your COVID 19 test results. Please ensure we have your most up to date contact information on file before you leave your appointment.     MyChart  you may be notified of results through your MyChart account. The easiest way to find all test results done at an [facility] facility is through your MyChart account, [facility] online patient portal. This is also an easy way to connect with your  primary care team for continued care, if this applies to you. If you do not already have   MyChart activated, you will be given an activation code at the time of testing. Please note that for children age 68-17 MyChart is not available.     You may receive a call from your Primary Care Physician or the physician who ordered the test with the results. You may also receive a call from [facility].   If none of the options above has helped you obtain your results, or you have additional questions, please contact the COVID 19 Notifications Call Center for further instructions at [phone], open 6 days a week, Monday-Saturday from 8:30am-5:00pm.     Again, the goal for results is 3-5 days, however with the current increase in testing volumes, tests can take up to 10 days for results to be ready. We appreciate your patience.     If you need help activating your MyChart account, please let us know. You can also download the MyChart application to your phone using the QR code below.         When can I stop isolation?    Until you receive your results, you should remain home and avoid contact with others (self-isolate).The decision to stop isolation precautions should be made on a case-by-case basis, in consultation with healthcare providers and state and local health departments.     Here is some general advice:      If your test result is negative, you should stay home until you do not have a fever  (without the use of fever reducing medication) or any respiratory symptoms for at least 3 days.      If your test result is positive, you should stay home for at least 10 days from the day your symptoms started. If you are still having symptoms at the end of 10 days, continue in-home isolation until 3 days after your symptoms stop. Talk to your healthcare provider to determine what follow-up is needed. You can also schedule telemedicine visits with your primary care physician to discuss ongoing symptoms or new concerns that may arise.     For further information on what you  and others in your household should do please visit the Center for Disease Control (CDC) website @ MoralGame.si.html     If you are ill and need to be seen by a healthcare provider, and your primary physician is not available, the [facility] are open for walk in visits.     [facility] - open M - F 8 am to 8 pm at the following locations:     [facility]: [address] [place], [place] [address]     [facility]: [address] [place], [place] [address]     Open Daily 8am  8pm including weekends at the following location:     [facility]: [address], [place], [place] [address]     In addition, all [facility] Emergency Departments can care for patients with all conditions, both COVID and non-COVID. Do not hesitate to seek care should you have a healthcare emergency.       Coronavirus Disease 2019 (COVID-19): Caring for Yourself or Others   If you or a household member have symptoms of COVID-19, follow the guidelines below for preventing spread of the virus, and managing symptoms.   If you think you have COVID-19 symptoms   Stay home. Call your healthcare provider and tell them you have symptoms of COVID-19. Do this before going to any hospital or clinic. Follow your provider's instructions. You may be advised to  isolate yourself at home. This is called self-isolation.   Dont panic. Keep in mind that other illnesses can cause similar symptoms.   Stay away from work, school, and public places. Limit physical contact with family members. Limit visitors. Don't kiss anyone or share eating or drinking utensils. Clean surfaces you touch with disinfectant. This is to help prevent the virus from spreading.   If you need to cough or sneeze, do it into a tissue. Then throw the tissue into the trash. If you don't have tissues, cough or sneeze into the bend of your elbow.   Dont share food or personal items with people in your household. This includes items like eating and drinking utensils, towels, and bedding.   Wear a cloth face mask around other people. During a public health emergency, medical face masks may be reserved for healthcare workers. You may need to make a cloth face mask of your own. You can do this using a bandana, T-shirt, or other cloth. The CDC has instructions on how to make a face mask. Wear the mask so that it covers both your nose and mouth.   If you need to go to a hospital or clinic, expect that the healthcare staff will wear protective equipment such as masks, gowns, gloves, and eye protection. You may be put in a separate room. This is to prevent the possible virus from spreading.   Tell the healthcare staff about recent travel. This includes local travel on public transport. Staff may need to find other people you have been in contact with.   Follow all instructions the healthcare staff give you.    If you have been diagnosed with COVID-19   Stay home and start self-isolation. Dont leave your home unless you need to get medical care. Don't go to work, school, or public areas. Don't use public transportation or taxis.   Follow all instructions from your healthcare provider. Call your healthcare providers office before going. They can prepare and give you instructions. This will help prevent the  virus from spreading.   If you need to go to a hospital or clinic, expect that the healthcare staff will wear protective equipment such as masks, gowns, gloves, and eye protection. You may be put in a separate room. This is to prevent the possible virus from spreading.   Wear a face mask. This is to protect other people from your germs. If you are not able to wear a mask, your caregivers should. During a public health emergency, medical face masks may be reserved for healthcare workers. You may need to make a cloth face mask of your own. You can do this using a bandana, T-shirt, or other cloth. The CDC has instructions on how to make a face mask. Wear the mask so that it covers both your nose and mouth.   Stay away from other people in your home.   Have no contact with pets and animals.   Dont share food or personal items with people in your household. This includes items like eating and drinking utensils, towels, and bedding.   If you need to cough or sneeze, do it into a tissue. Then throw the tissue into the trash. If you don't have tissues, cough or sneeze into the bend of your elbow.   Wash your hands often.    Self-care at home  There is currently no medicine approved to prevent or treat the virus. Some experimental and other medicines are being tested against COVID-19. Other medicines used to treat other conditions are being looked at for COVID-19, but they are not currently approved to treat it.   Current treatment is mainly aimed at helping your body while it fights the virus. This is known as supportive care. Take care of yourself at home by:    Getting rest. This helps your body fight the illness.   Staying hydrated.  Drinking liquids is the best way to prevent dehydration. Try to drink 6 to 8 glasses of liquids every day, or as advised by your provider. Also check with your provider about which fluids are best for you. Don't drink fluids that contain caffeine or alcohol.   Taking  over-the-counter (OTC) pain medicine. These are used to help ease pain and reduce fever. Follow your healthcare provider's instructions for which OTC medicine to use.  If you've been in the hospital for suspected or confirmed COVID-19 and now are home, follow all of your healthcare team's instructions. This will include when it's OK to stop self-isolation. You may also get instructions on position changes to help your breathing, such as lying on your belly (prone positioning).   If you've had confirmed COVID-19, your healthcare team may ask you to consider donating your plasma. This is called COVID-19 convalescent plasma donation. Plasma from people fully recovered from COVID-19 may contain antibodies to help fight COVID-19 in people who are currently seriously ill with the disease. Experts don't know the safety of COVID-19 convalescent plasma or how well it works. Research continues. The FDA has approved it for emergency use in certain people with serious or life-threatening COVID-19.   Caring for a sick person   Follow all instructions from healthcare staff.   Wash your hands often.   Wear protective clothing as advised.   Make sure the sick person wears a mask. If they can't wear a mask, don't stay in the same room with the person. If you must be in the same room, wear a face mask. When wearing a mask, make sure that it covers both  the nose and mouth.   Keep track of the sick persons symptoms.   Clean home surfaces often with disinfectant. This includes phones, kitchen counters, fridge door handle, bathroom surfaces, and others.   Dont let anyone share household items with the sick person. This includes eating and drinking tools, towels, sheets, or blankets.   Clean fabrics and laundry thoroughly.   Keep other people and pets away from the sick person.    When you can stop self-isolation  When you are sick with COVID-19, you should stay away from other people. This is called self-isolation.   Your  limits are different if you've had COVID-19 in the last 3 months but are fully recovered without symptoms and you have been exposed to someone with COVID-19. If you are symptom-free, you don't need to stay home away from others or be retested. The CDC doesn't recommend retesting unless you have symptoms of COVID-19 and your new symptoms can't be linked to another illness. Contact your healthcare provider if you have any questions. If you develop symptoms, stay home. If you had COVID-19 over 3 months ago and have been exposed again, treat it like you've never had COVID-19 and stay home, limit your contact with others, call your provider, and monitor for symptoms.   If you are normally healthy, the CDC does not advise retesting for COVID-19 with nose-throat swabs. You can stop self-isolation when all 3 of these are true:   1. You have had no fever for at least 24 hours. This means no fever without medicine that reduces fever, such as acetaminophen, for at least 24 hours.  2. Your symptoms such as cough or trouble breathing have improved.  3. It has been at least 10 days since your first symptoms started.  Talk with your healthcare provider before you leave home. Tell them if the 3 things above are true for you. They may tell you its OK to leave home. In some cases, your state or local area may have specific advice. Your healthcare provider will tell you more.   If you have a weak immune system and COVID-19, or if you've had severe COVID-19,  your instructions on when to stop isolation will be somewhat different. Some conditions and treatments can cause a weak immune system. These include cancer treatment, bone marrow or organ transplants, and conditions such as HIV or other immune system disorders. You may be advised to stay home from 10 days to 20 days after your symptoms first started. Your healthcare provider may want to retest you for COVID-19. Follow your provider's instructions.   When you return to public  settings  When you are well enough to go outside your home, consider the CDC's guidance on cloth face masks:      The CDC advises all people over age 22 to wear cloth face masks in public settings when around people outside of their household, especially when it's hard to socially distance. For example, wear a face mask in populated places such as public transit, public protests and marches, and crowded stores, bars, and restaurants.   Cloth masks may help prevent people who have COVID-19 form spreading the virus to others.   Cloth masks are most likely to reduce COVID-19 spread when masks are widely used by people who are out in the public.    Certain people should not wear a face covering. This includes:    Children younger than 105 years old   Anyone with a health, developmental, or mental  health condition that can be made worse by wearing a mask   Anyone who is unconscious or unable to remove the face covering without help. See the CDC's guidance on who should not wear a face mask.    When to call your healthcare provider  Call your healthcare provider right away if a sick person has any of these:    Trouble breathing   Pain or pressure in chest  If a sick person has any of these, call 911:   Trouble breathing that gets worse   Pain or pressure in chest that gets worse   Blue tint to lips or face   Fast or irregular heartbeat   Confusion or trouble waking   Fainting or loss of consciousness   Coughing up blood  Going home from the hospital   If you were diagnosed with COVID-19 and were recently discharged from the hospital:    Follow the instructions above for self-care and isolation.   Follow the hospital healthcare teams specific instructions.   Ask questions if anything is unclear to you. Write down answers so you remember them.  Date last modified: 09/10/2018  StayWell last reviewed this educational content on 04/16/2018   2000-2020 [employer], [place]. [address] [place], [place]  [address]. All rights reserved. This information is not intended as a substitute for professional medical care. Always follow your healthcare professional's instructions.

## 2019-01-21 NOTE — Progress Notes (Signed)
[facility]  PROGRESS NOTE     Patient: Brenda Rowland   Date: 01/21/2019   MRN: [id number]       Brenda Rowland is a 34 y.o. female      HISTORY     History obtained from: Patient    Chief Complaint   Patient presents with    Covid-19 Screening     sister's boyfriend tested postive - no symptoms currently;       HPI  34 yo female with pmhx htn in pregnancy presents for covid19 testing. +exposure to covid19. Reports that her sister's boyfriend had positive test results - sister is pending [phone] test but sister is symptomatic with close contact. Denies any symptoms currently. Reports she is breastfeeding a 55 month old infant at home.            Review of Systems   Constitutional: Negative.    Respiratory: Negative.    Cardiovascular: Negative.    Musculoskeletal: Negative.    Neurological: Negative.    Psychiatric/Behavioral: Negative.        History:    Pertinent Past Medical, Surgical, Family and Social History were reviewed.      No current outpatient medications on file.    No Known Allergies    Medications and Allergies reviewed.    PHYSICAL EXAM     Vitals:    01/21/19 0921 01/21/19 0933   BP: (!) 155/121 145/90   Pulse: 79 78   Temp: 97.6 F (36.4 C)    TempSrc: Tympanic    SpO2: 99%    Weight: 131.5 kg (290 lb)    Height: 1.727 m (5\' 8" )    Body mass index is 44.09 kg/m.      Physical Exam   Nursing note and vitals reviewed.  Constitutional: She is oriented to person, place, and time. She appears well-nourished. She is active and cooperative. No distress.   HENT:   Head: Atraumatic.   Right Ear: Tympanic membrane and ear canal normal.   Left Ear: Tympanic membrane and ear canal normal.   Mouth/Throat: No oropharyngeal exudate, posterior oropharyngeal edema or posterior oropharyngeal erythema.   Eyes: Right eye exhibits no discharge. Left eye exhibits no discharge.   Neck: Normal range of motion.   Cardiovascular: Normal rate, regular rhythm, S1 normal, S2 normal and normal heart sounds.   No murmur  heard.  Pulmonary/Chest: Effort normal and breath sounds normal. No respiratory distress. She has no decreased breath sounds. She has no wheezes. She has no rhonchi.   Abdominal: There is no abdominal tenderness.   Lymphadenopathy:     She has no cervical adenopathy.   Neurological: She is alert and oriented to person, place, and time.   Skin: Skin is warm and dry. She is not diaphoretic.       UCC COURSE         Results     ** No results found for the last 24 hours. **          No results found.    No current facility-administered medications for this visit.        PROCEDURES     Procedures    MEDICAL DECISION MAKING     History, physical, labs/studies most consistent with exposure to covid19 as the diagnosis.    Chart Review:  Prior PCP, Specialist and/or ED notes reviewed today: Yes  Prior labs/images/studies reviewed today: Yes    Differential Diagnosis: exposure [phone]  ASSESSMENT     Encounter Diagnosis   Name Primary?    Exposure to COVID-19 virus Yes            PLAN      PLAN:     -Covid test ordered - explained that rec is to test 5-7 days after exposure so it may be too soon for testing; reviewed MyChart activation for test results  -Quarantine in home setting for 14 days  -Discussed that negative covid test does not end quarantine period early  -If develop symptoms of covid, then should assume positive for covid and complete covid isolation time period  -ED precautions reviewed  -Qualified for Extended Covid Care Clinic based on BMI - reviewed telephone number on AVS with patient   -Work note provided  -Patient verbalized understanding and had no further questions/concerns      Orders Placed This Encounter   Procedures    COVID-19 ASYMPTOMATIC Patient with Provider Order    Ambulatory referral to Extended Covid-19 Care Clinic Assessment     Requested Prescriptions      No prescriptions requested or ordered in this encounter       Discussed results and diagnosis with patient/family.  Reviewed  warning signs for worsening condition, as well as, indications for follow-up with primary care physician and return to urgent care clinic.   Patient/family expressed understanding of instructions.     An After Visit Summary was provided to the patient.

## 2019-01-23 LAB — CORONAVIRUS 2 (SARS-COV-2) RNA DETECTION: SARS CoV 2 Overall Result: NOT DETECTED

## 2019-03-22 IMAGING — US US MFM OB TRANSVAGINAL
1 series · 13 of 28 positions shown · non-contrast
Comparison: none

[Series 1: us mfm ob transvaginal · 13 of 94 slices shown]
[im 4/94]
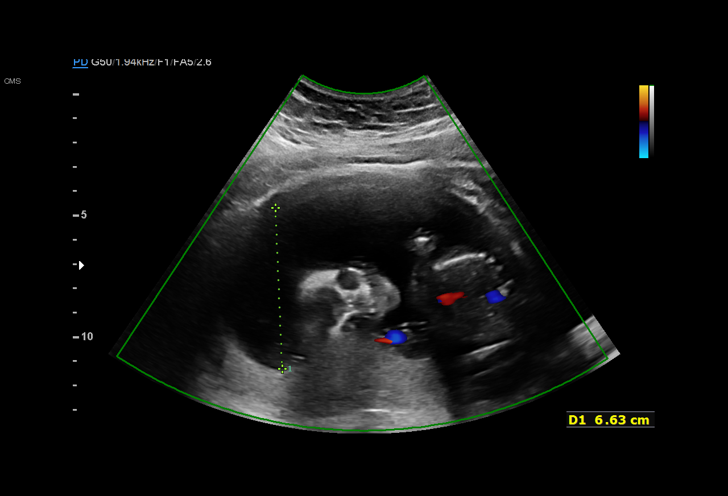
[im 11/94]
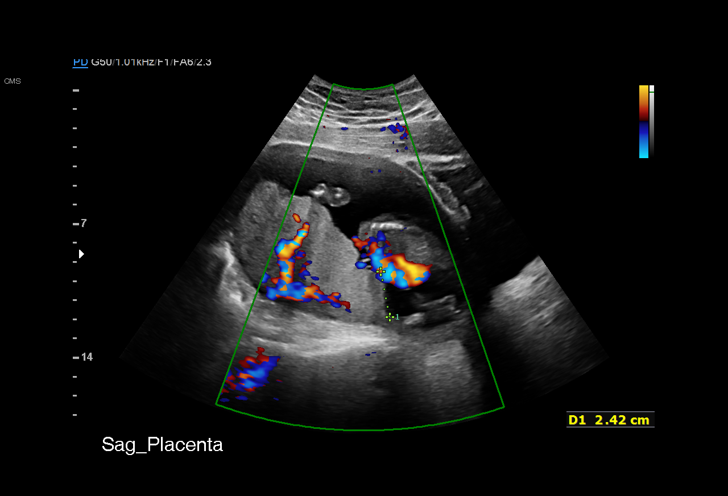
[im 18/94]
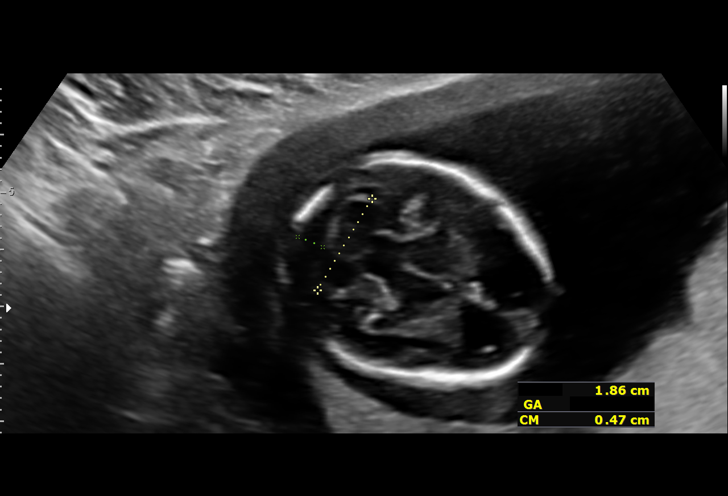
[im 25/94]
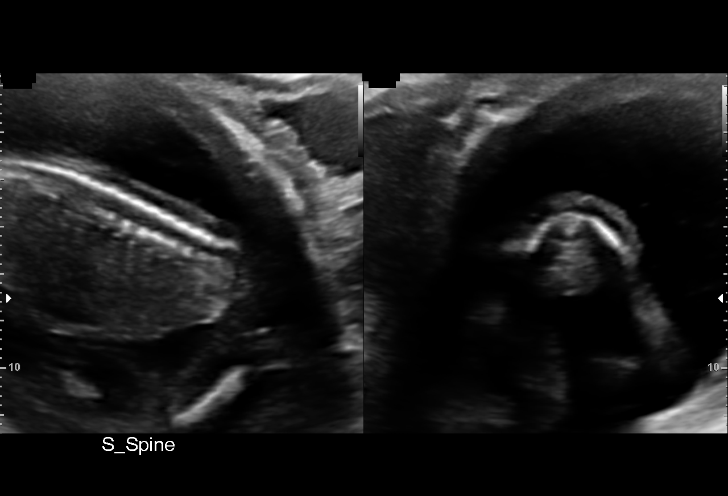
[im 32/94]
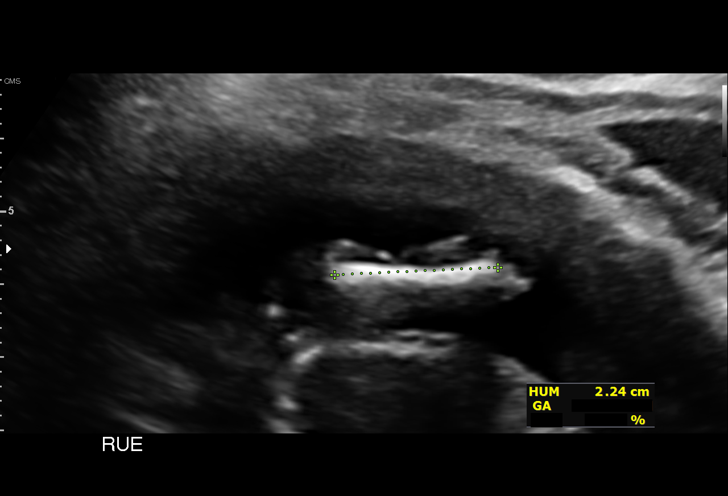
[im 38/94]
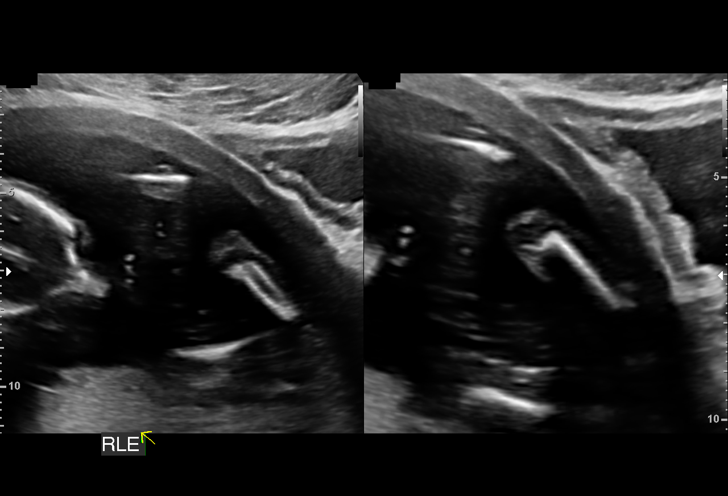
[im 49/94]
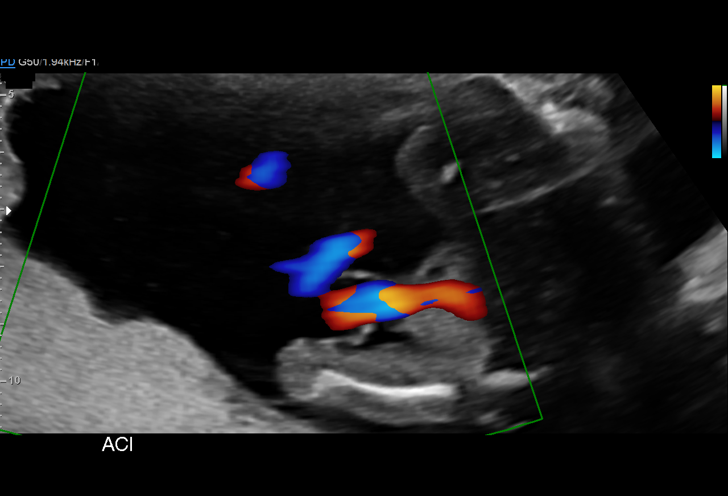
[im 56/94]
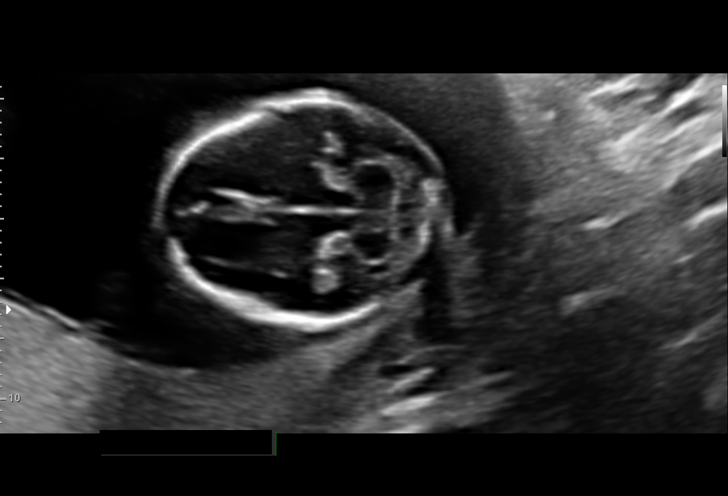
[im 63/94]
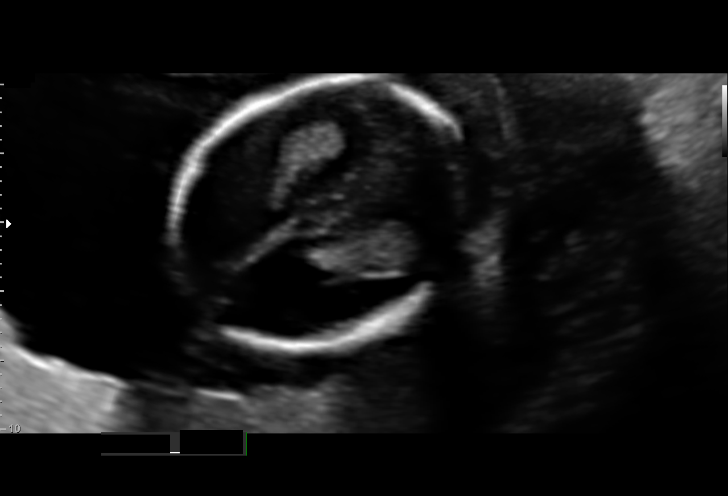
[im 69/94]
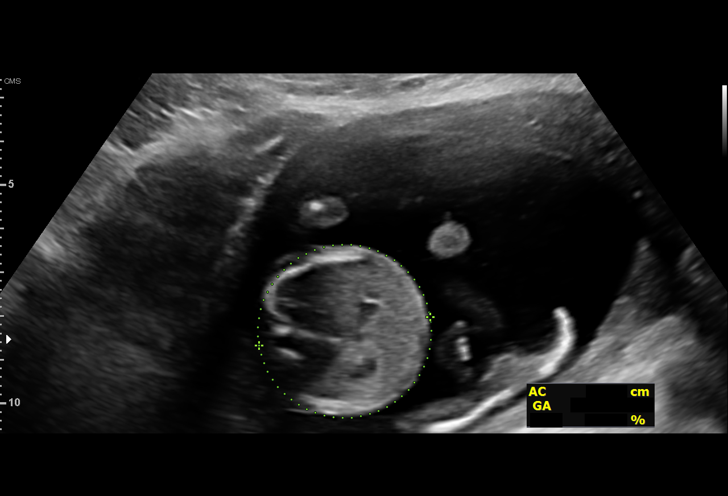
[im 76/94]
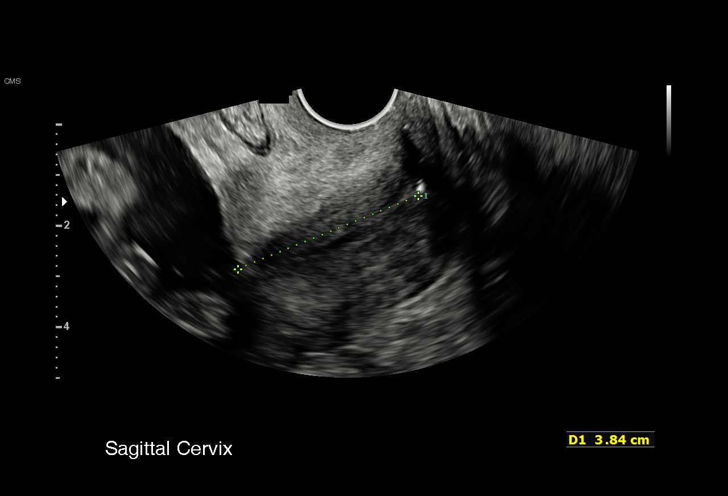
[im 83/94]
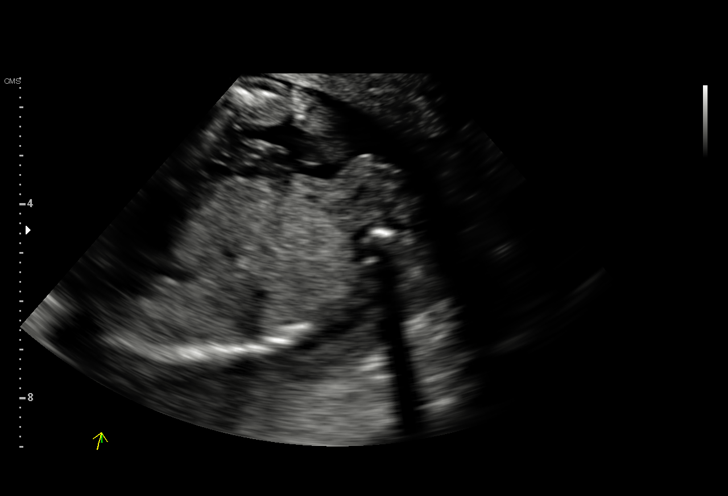
[im 90/94]
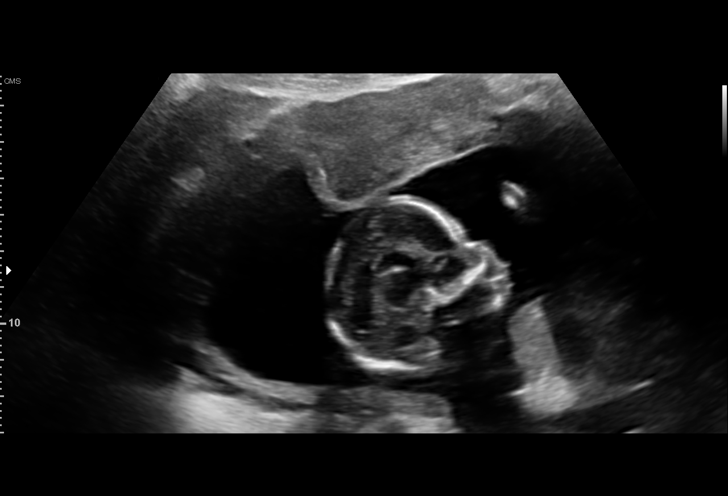

[13 of 28 positions shown; findings below may reference images not displayed]

[facility] Clinic

  2  US MFM OB TRANSVAGINAL               76817.2      N[facility]
 ----------------------------------------------------------------------

 ----------------------------------------------------------------------
Indications

  Encounter for antenatal screening for
  malformations
  Encounter for cervical length
  Obesity complicating pregnancy, second
  trimester
  Medical complication of pregnancy
  (Heterotopic pregnancy surgically removed
  in [DATE] weeks gestation of pregnancy
 ----------------------------------------------------------------------
Fetal Evaluation

 Num Of Fetuses:         1
 Fetal Heart Rate(bpm):  152
 Cardiac Activity:       Observed
 Presentation:           Transverse, head to maternal right
 Placenta:               Posterior
 P. Cord Insertion:      Visualized

 Amniotic Fluid
 AFI FV:      Within normal limits

                             Largest Pocket(cm)

Biometry

 BPD:      36.9  mm     G. Age:  17w 2d         25  %    CI:        78.01   %    70 - 86
                                                         FL/HC:      18.3   %    15.8 - 18
 HC:      132.2  mm     G. Age:  16w 6d          5  %    HC/AC:      1.10        1.07 -
 AC:      119.7  mm     G. Age:  17w 5d         42  %    FL/BPD:     65.6   %
 FL:       24.2  mm     G. Age:  17w 2d         25  %    FL/AC:      20.2   %    20 - 24
 HUM:      22.2  mm     G. Age:  16w 5d         23  %
 CER:        18  mm     G. Age:  18w 0d         52  %
 NFT:       3.9  mm

 CM:        4.3  mm

 Est. FW:     194  gm      0 lb 7 oz     40  %
OB History

 Gravidity:    2         Term:   0        Prem:   0        SAB:   1
 TOP:          0       Ectopic:  0        Living: 0
Gestational Age

 LMP:           17w 6d        Date:  07/25/17                 EDD:   05/01/18
 U/S Today:     17w 2d                                        EDD:   05/05/18
 Best:          17w 6d     Det. By:  LMP  (07/25/17)          EDD:   05/01/18
Anatomy

 Cranium:               Appears normal         LVOT:                   Not well visualized
 Cavum:                 Appears normal         Aortic Arch:            Not well visualized
 Ventricles:            Appears normal         Ductal Arch:            Not well visualized
 Choroid Plexus:        Appears normal         Diaphragm:              Not well visualized
 Cerebellum:            Appears normal         Stomach:                Appears normal, left
                                                                       sided
 Posterior Fossa:       Appears normal         Abdomen:                Appears normal
 Nuchal Fold:           Appears normal         Abdominal Wall:         Appears nml (cord
                                                                       insert, abd wall)
 Face:                  Appears normal         Cord Vessels:           2 vessel cord,
                        (orbits and profile)
                                                                       absent Rachiad Df
 Lips:                  Appears normal         Kidneys:                Appear normal
 Palate:                Not well visualized    Bladder:                Appears normal
 Thoracic:              Appears normal         Spine:                  Appears normal
 Heart:                 Not well visualized    Upper Extremities:      Appears normal
 RVOT:                  Not well visualized    Lower Extremities:      Appears normal

 Other:  5th digits and heels visualized. Nasal bone visualized. Technically
         difficult due to maternal habitus and fetal position.
Cervix Uterus Adnexa

 Cervix
 Length:            3.8  cm.
 Normal appearance by transvaginal scan

 Uterus
 No abnormality visualized.

 Left Ovary
 Within normal limits.
 Right Ovary
 Within normal limits.

 Cul De Sac
 No free fluid seen.

 Adnexa
 No abnormality visualized.
Comments

 U/S images reviewed. Findings reviewed with patient.
 Appropriate fetal growth is noted.   A 2 vessel cord is
 identified.  No other fetal abnormalities are seen.  Questions
 answered.
 15 minutes spent face to face with patient.
 Recommendations: 1) Serial U/S every 4 weeks for fetal
 growth 2) Weekly BPP beginning @ 36 weeks
Recommendations

 1) Serial U/S every 4 weeks for fetal growth 2) Weekly BPP
 beginning @ 36 weeks
              Rodelo, Hiram

## 2019-06-03 ENCOUNTER — Other Ambulatory Visit: Payer: Self-pay | Admitting: Registered Nurse
